# Patient Record
Sex: Male | Born: 1965 | Race: White | Hispanic: No | Marital: Single | Smoking: Current every day smoker
Health system: Southern US, Community
[De-identification: ages and names within clinical notes are randomized; demographics above are authoritative.]

## PROBLEM LIST (undated history)

## (undated) DIAGNOSIS — M1990 Unspecified osteoarthritis, unspecified site: Secondary | ICD-10-CM

## (undated) DIAGNOSIS — M545 Low back pain, unspecified: Secondary | ICD-10-CM

## (undated) DIAGNOSIS — E039 Hypothyroidism, unspecified: Secondary | ICD-10-CM

## (undated) DIAGNOSIS — B182 Chronic viral hepatitis C: Secondary | ICD-10-CM

## (undated) DIAGNOSIS — F1910 Other psychoactive substance abuse, uncomplicated: Secondary | ICD-10-CM

## (undated) DIAGNOSIS — G4730 Sleep apnea, unspecified: Secondary | ICD-10-CM

## (undated) DIAGNOSIS — E785 Hyperlipidemia, unspecified: Secondary | ICD-10-CM

## (undated) DIAGNOSIS — F1020 Alcohol dependence, uncomplicated: Secondary | ICD-10-CM

## (undated) DIAGNOSIS — J439 Emphysema, unspecified: Secondary | ICD-10-CM

## (undated) HISTORY — DX: Unspecified osteoarthritis, unspecified site: M19.90

## (undated) HISTORY — DX: Chronic viral hepatitis C: B18.2

## (undated) HISTORY — DX: Hyperlipidemia, unspecified: E78.5

## (undated) HISTORY — PX: APPENDECTOMY: SHX54

## (undated) HISTORY — DX: Other psychoactive substance abuse, uncomplicated: F19.10

## (undated) HISTORY — DX: Low back pain: M54.5

## (undated) HISTORY — DX: Low back pain, unspecified: M54.50

## (undated) HISTORY — DX: Sleep apnea, unspecified: G47.30

## (undated) HISTORY — DX: Emphysema, unspecified: J43.9

---

## 2007-09-03 ENCOUNTER — Ambulatory Visit: Payer: Self-pay | Admitting: Gastroenterology

## 2007-09-26 ENCOUNTER — Ambulatory Visit: Payer: Self-pay | Admitting: Gastroenterology

## 2013-08-06 ENCOUNTER — Ambulatory Visit: Payer: Self-pay | Admitting: Family Medicine

## 2013-08-15 ENCOUNTER — Ambulatory Visit: Payer: Self-pay | Admitting: Family Medicine

## 2014-08-01 ENCOUNTER — Emergency Department: Payer: Self-pay | Admitting: Emergency Medicine

## 2014-08-01 LAB — CBC
HCT: 50.5 % (ref 40.0–52.0)
HGB: 15.9 g/dL (ref 13.0–18.0)
MCH: 28.6 pg (ref 26.0–34.0)
MCHC: 31.5 g/dL — AB (ref 32.0–36.0)
MCV: 91 fL (ref 80–100)
Platelet: 209 10*3/uL (ref 150–440)
RBC: 5.55 10*6/uL (ref 4.40–5.90)
RDW: 14.1 % (ref 11.5–14.5)
WBC: 9.4 10*3/uL (ref 3.8–10.6)

## 2014-08-01 LAB — URINALYSIS, COMPLETE
BACTERIA: NONE SEEN
BLOOD: NEGATIVE
Bilirubin,UR: NEGATIVE
GLUCOSE, UR: NEGATIVE mg/dL (ref 0–75)
Ketone: NEGATIVE
LEUKOCYTE ESTERASE: NEGATIVE
Nitrite: NEGATIVE
PROTEIN: NEGATIVE
Ph: 7 (ref 4.5–8.0)
SPECIFIC GRAVITY: 1.004 (ref 1.003–1.030)
SQUAMOUS EPITHELIAL: NONE SEEN
WBC UR: NONE SEEN /HPF (ref 0–5)

## 2014-08-01 LAB — BASIC METABOLIC PANEL
Anion Gap: 12 (ref 7–16)
BUN: 10 mg/dL (ref 7–18)
CO2: 24 mmol/L (ref 21–32)
Calcium, Total: 8.6 mg/dL (ref 8.5–10.1)
Chloride: 105 mmol/L (ref 98–107)
Creatinine: 1.07 mg/dL (ref 0.60–1.30)
EGFR (African American): >60
Glucose: 96 mg/dL (ref 65–99)
Osmolality: 280 (ref 275–301)
POTASSIUM: 3.8 mmol/L (ref 3.5–5.1)
SODIUM: 141 mmol/L (ref 136–145)

## 2014-08-01 LAB — TROPONIN I: Troponin-I: <0.02 ng/mL

## 2015-08-05 ENCOUNTER — Ambulatory Visit: Payer: Self-pay | Admitting: Family Medicine

## 2015-08-19 ENCOUNTER — Telehealth: Payer: Self-pay | Admitting: Family Medicine

## 2015-08-19 DIAGNOSIS — E039 Hypothyroidism, unspecified: Secondary | ICD-10-CM | POA: Insufficient documentation

## 2015-08-19 NOTE — Telephone Encounter (Signed)
Pt would like a call back concerning antidepressants . He had to leave the rehab today and says its urgent that he speak with somebody.

## 2015-08-20 NOTE — Telephone Encounter (Signed)
Phone call returned lunch and end of day on Wednesday and Thursday, with no return call from patient. I did leave messages that Dr. Jeananne Rama was leaving for vacation but call office for another provider if needed

## 2015-09-22 ENCOUNTER — Encounter: Payer: Self-pay | Admitting: Family Medicine

## 2015-09-22 ENCOUNTER — Ambulatory Visit (INDEPENDENT_AMBULATORY_CARE_PROVIDER_SITE_OTHER): Payer: BLUE CROSS/BLUE SHIELD | Admitting: Family Medicine

## 2015-09-22 VITALS — BP 121/84 | HR 72 | Temp 98.4°F | Ht 66.0 in | Wt 186.0 lb

## 2015-09-22 DIAGNOSIS — F32A Depression, unspecified: Secondary | ICD-10-CM

## 2015-09-22 DIAGNOSIS — F1910 Other psychoactive substance abuse, uncomplicated: Secondary | ICD-10-CM

## 2015-09-22 DIAGNOSIS — Z23 Encounter for immunization: Secondary | ICD-10-CM | POA: Diagnosis not present

## 2015-09-22 DIAGNOSIS — IMO0001 Reserved for inherently not codable concepts without codable children: Secondary | ICD-10-CM

## 2015-09-22 DIAGNOSIS — F329 Major depressive disorder, single episode, unspecified: Secondary | ICD-10-CM | POA: Diagnosis not present

## 2015-09-22 DIAGNOSIS — F1020 Alcohol dependence, uncomplicated: Secondary | ICD-10-CM

## 2015-09-22 DIAGNOSIS — F1010 Alcohol abuse, uncomplicated: Secondary | ICD-10-CM | POA: Insufficient documentation

## 2015-09-22 HISTORY — DX: Reserved for inherently not codable concepts without codable children: IMO0001

## 2015-09-22 MED ORDER — DISULFIRAM 250 MG PO TABS: 250.0000 mg | ORAL_TABLET | Freq: Every day | ORAL | Status: DC

## 2015-09-22 MED ORDER — DISULFIRAM 500 MG PO TABS: 500.0000 mg | ORAL_TABLET | Freq: Every day | ORAL | Status: DC

## 2015-09-22 NOTE — Assessment & Plan Note (Signed)
Patient also abusing cocaine

## 2015-09-22 NOTE — Progress Notes (Signed)
   BP 121/84 mmHg  Pulse 72  Temp(Src) 98.4 F (36.9 C)  Ht 5\' 6"  (1.676 m)  Wt 186 lb (84.369 kg)  BMI 30.04 kg/m2  SpO2 97%   Subjective:    Patient ID: Earl Baker, male    DOB: [birth year], 49 y.o.   MRN: [id number]  HPI: Earl Baker is a 49 y.o. male  Chief Complaint  Patient presents with  . alcohol use   patient working with a therapist still having depression fluoxetine hasn't helped with stopping alcohol still drinking on a regular basis Patient tried a was inpatient program and and insurance only covered 1 week which obviously wasn't enough Patient and Have Discussed Antabuse Has Worked in the past and Wants to Get Started on That Again. Taking fluoxetine without side effects Taking thyroid medications without side effects. Patient still able to work  Relevant past medical, surgical, family and social history reviewed and updated as indicated. Interim medical history since our last visit reviewed. Allergies and medications reviewed and updated.  Review of Systems  Constitutional: Negative.   Respiratory: Negative.   Cardiovascular: Negative.     Per HPI unless specifically indicated above     Objective:    BP 121/84 mmHg  Pulse 72  Temp(Src) 98.4 F (36.9 C)  Ht 5\' 6"  (1.676 m)  Wt 186 lb (84.369 kg)  BMI 30.04 kg/m2  SpO2 97%  Wt Readings from Last 3 Encounters:  09/22/15 186 lb (84.369 kg)  04/29/15 183 lb (83.008 kg)    Physical Exam  Constitutional: He is oriented to person, place, and time. He appears well-developed and well-nourished. No distress.  HENT:  Head: Normocephalic and atraumatic.  Right Ear: Hearing normal.  Left Ear: Hearing normal.  Nose: Nose normal.  Eyes: Conjunctivae and lids are normal. Right eye exhibits no discharge. Left eye exhibits no discharge. No scleral icterus.  Cardiovascular: Normal rate, regular rhythm and normal heart sounds.   Pulmonary/Chest: Effort normal and breath sounds normal. No  respiratory distress.  Musculoskeletal: Normal range of motion.  Neurological: He is alert and oriented to person, place, and time.  Skin: Skin is intact. No rash noted.  Psychiatric: He has a normal mood and affect. His speech is normal and behavior is normal. Judgment and thought content normal. Cognition and memory are normal.        Assessment & Plan:   Problem List Items Addressed This Visit      Other   Alcoholism /alcohol abuse ([place])    Discuss treatment and risk will begin Antabuse 500 mg a day for 2 weeks then dropped to 250 one a day Patient will make sure he is off alcohol for 12 hours to start Discuss cocaine cessation as doesn't do cocaine unless his drinking. Also doesn't smoke and was his drinking.      Substance abuse    Patient also abusing cocaine      Depression    Will continue fluoxetine       Other Visit Diagnoses    Immunization due    -  Primary    Relevant Orders    Flu Vaccine QUAD 36+ mos PF IM (Fluarix & Fluzone Quad PF) (Completed)        Follow up plan: Return in about 4 weeks (around 10/20/2015), or if symptoms worsen or fail to improve, for re check.

## 2015-09-22 NOTE — Assessment & Plan Note (Signed)
Will continue fluoxetine

## 2015-09-22 NOTE — Assessment & Plan Note (Signed)
Discuss treatment and risk will begin Antabuse 500 mg a day for 2 weeks then dropped to 250 one a day Patient will make sure he is off alcohol for 12 hours to start Discuss cocaine cessation as doesn't do cocaine unless his drinking. Also doesn't smoke and was his drinking.

## 2015-10-20 ENCOUNTER — Ambulatory Visit (INDEPENDENT_AMBULATORY_CARE_PROVIDER_SITE_OTHER): Payer: BLUE CROSS/BLUE SHIELD | Admitting: Family Medicine

## 2015-10-20 ENCOUNTER — Encounter: Payer: Self-pay | Admitting: Family Medicine

## 2015-10-20 VITALS — BP 109/75 | HR 79 | Temp 98.8°F | Ht 65.5 in | Wt 184.0 lb

## 2015-10-20 DIAGNOSIS — F32A Depression, unspecified: Secondary | ICD-10-CM

## 2015-10-20 DIAGNOSIS — IMO0001 Reserved for inherently not codable concepts without codable children: Secondary | ICD-10-CM

## 2015-10-20 DIAGNOSIS — F1020 Alcohol dependence, uncomplicated: Secondary | ICD-10-CM | POA: Diagnosis not present

## 2015-10-20 DIAGNOSIS — F329 Major depressive disorder, single episode, unspecified: Secondary | ICD-10-CM | POA: Diagnosis not present

## 2015-10-20 MED ORDER — FLUOXETINE HCL 20 MG PO TABS: 20.0000 mg | ORAL_TABLET | Freq: Every day | ORAL | Status: DC

## 2015-10-20 NOTE — Assessment & Plan Note (Signed)
Has been dry for a week now working with therapist Discussed Alcoholics Anonymous also

## 2015-10-20 NOTE — Progress Notes (Signed)
BP 109/75 mmHg  Pulse 79  Temp(Src) 98.8 F (37.1 C)  Ht 5' 5.5" (1.664 m)  Wt 184 lb (83.462 kg)  BMI 30.14 kg/m2  SpO2 97%   Subjective:    Patient ID: Earl Baker, male    DOB: September 11, 1966, 49 y.o.   MRN: [id number]  HPI: Earl Baker is a 49 y.o. male  No chief complaint on file.  patient follow-up has not drank for over a week and is playing golf with buddies. Still working with therapist on every two-week basis Taking Antabuse without any problems or issues Depression is stable on Prozac and taking without side effects Patient's developed mild URI symptoms some head congestion and drainage as been ongoing for about a week but getting better  Relevant past medical, surgical, family and social history reviewed and updated as indicated. Interim medical history since our last visit reviewed. Allergies and medications reviewed and updated. Other than noted above Review of Systems  Constitutional: Negative.   Respiratory: Negative.   Cardiovascular: Negative.     Per HPI unless specifically indicated above     Objective:    BP 109/75 mmHg  Pulse 79  Temp(Src) 98.8 F (37.1 C)  Ht 5' 5.5" (1.664 m)  Wt 184 lb (83.462 kg)  BMI 30.14 kg/m2  SpO2 97%  Wt Readings from Last 3 Encounters:  10/20/15 184 lb (83.462 kg)  09/22/15 186 lb (84.369 kg)  04/29/15 183 lb (83.008 kg)    Physical Exam  Constitutional: He is oriented to person, place, and time. He appears well-developed and well-nourished. No distress.  HENT:  Head: Normocephalic and atraumatic.  Right Ear: Hearing normal.  Left Ear: Hearing normal.  Nose: Nose normal.  Eyes: Conjunctivae and lids are normal. Right eye exhibits no discharge. Left eye exhibits no discharge. No scleral icterus.  Cardiovascular: Normal rate, regular rhythm and normal heart sounds.   Pulmonary/Chest: Effort normal and breath sounds normal. No respiratory distress.  Musculoskeletal: Normal range of motion.   Neurological: He is alert and oriented to person, place, and time.  Skin: Skin is intact. No rash noted.  Psychiatric: He has a normal mood and affect. His speech is normal and behavior is normal. Judgment and thought content normal. Cognition and memory are normal.    Results for orders placed or performed in visit on 08/01/14  Urinalysis, Complete  Result Value Ref Range   Color - urine Straw    Clarity - urine Clear    Glucose,UR Negative 0-75 mg/dL   Bilirubin,UR Negative NEGATIVE   Ketone Negative NEGATIVE   Specific Gravity 1.004 1.003-1.030   Blood Negative NEGATIVE   Ph 7.0 4.5-8.0   Protein Negative NEGATIVE   Nitrite Negative NEGATIVE   Leukocyte Esterase Negative NEGATIVE   RBC,UR 1 /HPF 0-5 /HPF   WBC UR NONE SEEN 0-5 /HPF   Bacteria NONE SEEN NONE SEEN   Squamous Epithelial NONE SEEN   Troponin I  Result Value Ref Range   Troponin-I < 0.02 ng/mL  CBC  Result Value Ref Range   WBC 9.4 3.8-10.6 x10 3/mm 3   RBC 5.55 4.40-5.90 x10 6/mm 3   HGB 15.9 13.0-18.0 g/dL   HCT 50.5 40.0-52.0 %   MCV 91 80-100 fL   MCH 28.6 26.0-34.0 pg   MCHC 31.5 (L) 32.0-36.0 g/dL   RDW 14.1 11.5-14.5 %   Platelet 209 150-440 x10 3/mm 3  Basic metabolic panel  Result Value Ref Range   Glucose 96 65-99 mg/dL  BUN 10 7-18 mg/dL   Creatinine 1.07 0.60-1.30 mg/dL   Sodium 141 136-145 mmol/L   Potassium 3.8 3.5-5.1 mmol/L   Chloride 105 98-107 mmol/L   Co2 24 21-32 mmol/L   Calcium, Total 8.6 8.5-10.1 mg/dL   Osmolality 280 275-301   Anion Gap 12 7-16   EGFR (African American) >60    EGFR (Non-African Amer.) >60       Assessment & Plan:   Problem List Items Addressed This Visit      Other   Depression    Patient doing stable doesn't feel like he needs alcohol to self medicate depression      Relevant Medications   FLUoxetine (PROZAC) 20 MG tablet   Alcoholism /alcohol abuse ([place]) - Primary    Has been dry for a week now working with therapist Discussed Alcoholics  Anonymous also         URI resolving Follow up plan: Return in about 6 months (around 04/18/2016) for Physical Exam. sooner if problems

## 2015-10-20 NOTE — Assessment & Plan Note (Signed)
Patient doing stable doesn't feel like he needs alcohol to self medicate depression

## 2015-12-01 ENCOUNTER — Encounter: Payer: Self-pay | Admitting: Family Medicine

## 2015-12-01 ENCOUNTER — Ambulatory Visit (INDEPENDENT_AMBULATORY_CARE_PROVIDER_SITE_OTHER): Payer: BLUE CROSS/BLUE SHIELD | Admitting: Family Medicine

## 2015-12-01 VITALS — BP 106/74 | HR 80 | Temp 97.8°F | Ht 65.1 in | Wt 182.0 lb

## 2015-12-01 DIAGNOSIS — R002 Palpitations: Secondary | ICD-10-CM

## 2015-12-01 NOTE — Assessment & Plan Note (Signed)
Patient with ongoing chronic palpitations had evaluation last year with to monitor which was negative Will proceed as they seem to have gotten worse and more persistent with 24-hour EKG and cardiology referral We'll try to get it all done this year as patient has met his deductible.

## 2015-12-01 NOTE — Progress Notes (Signed)
BP 106/74 mmHg  Pulse 80  Temp(Src) 97.8 F (36.6 C)  Ht 5' 5.1" (1.654 m)  Wt 182 lb (82.555 kg)  BMI 30.18 kg/m2  SpO2 95%   Subjective:    Patient ID: Earl Baker, male    DOB: 01-09-1966, 49 y.o.   MRN: [id number]  HPI: Earl Baker is a 49 y.o. male  Chief Complaint  Patient presents with  . Palpitations   patient with marked episodes of palpitations more when lying on his back or watching TV will have skipped beats that may be one a minute or so her some 10 minute then won't have any for a while there is no associated symptoms. No nausea vomiting diaphoresis patient's able to do exertion without problems. Patient not drinking Nerves doing okay  Relevant past medical, surgical, family and social history reviewed and updated as indicated. Interim medical history since our last visit reviewed. Allergies and medications reviewed and updated.  Review of Systems  Constitutional: Negative.   HENT: Negative.   Respiratory: Negative.   Cardiovascular:       Some occasional left chest dull pain  Gastrointestinal: Negative.     Per HPI unless specifically indicated above     Objective:    BP 106/74 mmHg  Pulse 80  Temp(Src) 97.8 F (36.6 C)  Ht 5' 5.1" (1.654 m)  Wt 182 lb (82.555 kg)  BMI 30.18 kg/m2  SpO2 95%  Wt Readings from Last 3 Encounters:  12/01/15 182 lb (82.555 kg)  10/20/15 184 lb (83.462 kg)  09/22/15 186 lb (84.369 kg)    Physical Exam  Constitutional: He is oriented to person, place, and time. He appears well-developed and well-nourished. No distress.  HENT:  Head: Normocephalic and atraumatic.  Right Ear: Hearing normal.  Left Ear: Hearing normal.  Nose: Nose normal.  Eyes: Conjunctivae and lids are normal. Right eye exhibits no discharge. Left eye exhibits no discharge. No scleral icterus.  Cardiovascular: Normal rate, regular rhythm and normal heart sounds.   Pulmonary/Chest: Effort normal and breath sounds normal. No  respiratory distress.  Musculoskeletal: Normal range of motion.  Neurological: He is alert and oriented to person, place, and time.  Skin: Skin is intact. No rash noted.  Psychiatric: He has a normal mood and affect. His speech is normal and behavior is normal. Judgment and thought content normal. Cognition and memory are normal.   Reviewed EKG normal sinus rhythm with no acute changes  Results for orders placed or performed in visit on 08/01/14  Urinalysis, Complete  Result Value Ref Range   Color - urine Straw    Clarity - urine Clear    Glucose,UR Negative 0-75 mg/dL   Bilirubin,UR Negative NEGATIVE   Ketone Negative NEGATIVE   Specific Gravity 1.004 1.003-1.030   Blood Negative NEGATIVE   Ph 7.0 4.5-8.0   Protein Negative NEGATIVE   Nitrite Negative NEGATIVE   Leukocyte Esterase Negative NEGATIVE   RBC,UR 1 /HPF 0-5 /HPF   WBC UR NONE SEEN 0-5 /HPF   Bacteria NONE SEEN NONE SEEN   Squamous Epithelial NONE SEEN   Troponin I  Result Value Ref Range   Troponin-I < 0.02 ng/mL  CBC  Result Value Ref Range   WBC 9.4 3.8-10.6 x10 3/mm 3   RBC 5.55 4.40-5.90 x10 6/mm 3   HGB 15.9 13.0-18.0 g/dL   HCT 50.5 40.0-52.0 %   MCV 91 80-100 fL   MCH 28.6 26.0-34.0 pg   MCHC 31.5 (L) 32.0-36.0 g/dL  RDW 14.1 11.5-14.5 %   Platelet 209 150-440 x10 3/mm 3  Basic metabolic panel  Result Value Ref Range   Glucose 96 65-99 mg/dL   BUN 10 7-18 mg/dL   Creatinine 1.07 0.60-1.30 mg/dL   Sodium 141 136-145 mmol/L   Potassium 3.8 3.5-5.1 mmol/L   Chloride 105 98-107 mmol/L   Co2 24 21-32 mmol/L   Calcium, Total 8.6 8.5-10.1 mg/dL   Osmolality 280 275-301   Anion Gap 12 7-16   EGFR (African American) >60    EGFR (Non-African Amer.) >60       Assessment & Plan:   Problem List Items Addressed This Visit      Other   Palpitations - Primary    Patient with ongoing chronic palpitations had evaluation last year with to monitor which was negative Will proceed as they seem to have  gotten worse and more persistent with 24-hour EKG and cardiology referral We'll try to get it all done this year as patient has met his deductible.      Relevant Orders   EKG 12-Lead (Completed)   Ambulatory referral to Cardiology   Comprehensive metabolic panel   CBC with Differential/Platelet   TSH       Follow up plan: Return in about 6 months (around 05/31/2016) for May for , Physical Exam.

## 2015-12-02 ENCOUNTER — Encounter: Payer: Self-pay | Admitting: Family Medicine

## 2015-12-02 LAB — CBC WITH DIFFERENTIAL/PLATELET
Basophils Absolute: 0 10*3/uL (ref 0.0–0.2)
Basos: 0 %
EOS (ABSOLUTE): 0.2 10*3/uL (ref 0.0–0.4)
EOS: 3 %
HEMATOCRIT: 47.2 % (ref 37.5–51.0)
HEMOGLOBIN: 15.8 g/dL (ref 12.6–17.7)
IMMATURE GRANS (ABS): 0 10*3/uL (ref 0.0–0.1)
IMMATURE GRANULOCYTES: 0 %
LYMPHS ABS: 2.6 10*3/uL (ref 0.7–3.1)
Lymphs: 35 %
MCH: 28.6 pg (ref 26.6–33.0)
MCHC: 33.5 g/dL (ref 31.5–35.7)
MCV: 85 fL (ref 79–97)
MONOCYTES: 13 %
Monocytes Absolute: 1 10*3/uL — ABNORMAL HIGH (ref 0.1–0.9)
Neutrophils Absolute: 3.7 10*3/uL (ref 1.4–7.0)
Neutrophils: 49 %
Platelets: 260 10*3/uL (ref 150–379)
RBC: 5.53 x10E6/uL (ref 4.14–5.80)
RDW: 13.8 % (ref 12.3–15.4)
WBC: 7.6 10*3/uL (ref 3.4–10.8)

## 2015-12-02 LAB — COMPREHENSIVE METABOLIC PANEL
Albumin/Globulin Ratio: 1.8 (ref 1.1–2.5)
Globulin, Total: 2.5 g/dL (ref 1.5–4.5)
Total Protein: 6.9 g/dL (ref 6.0–8.5)

## 2015-12-02 LAB — TSH: TSH: 1.01 u[IU]/mL (ref 0.450–4.500)

## 2015-12-08 ENCOUNTER — Telehealth: Payer: Self-pay

## 2015-12-08 DIAGNOSIS — R002 Palpitations: Secondary | ICD-10-CM

## 2015-12-08 NOTE — Telephone Encounter (Signed)
Call patient about Holter results

## 2015-12-09 NOTE — Assessment & Plan Note (Signed)
Phone call Review Holter monitor with patient today 12/09/2015 PACs patient has cardiology to further follow-up with Will observe for now.

## 2015-12-10 ENCOUNTER — Ambulatory Visit: Payer: Self-pay | Admitting: Cardiovascular Disease

## 2016-01-27 ENCOUNTER — Ambulatory Visit: Payer: Self-pay | Admitting: Cardiovascular Disease

## 2016-04-21 ENCOUNTER — Telehealth: Payer: Self-pay

## 2016-04-21 MED ORDER — FLUOXETINE HCL 20 MG PO TABS: 20.0000 mg | ORAL_TABLET | Freq: Every day | ORAL | Status: DC

## 2016-04-21 NOTE — Telephone Encounter (Signed)
[name] is requesting a 90 day Rx  Fluoxetine 20mg  TAB  Patient's appointment is May 25, 2016

## 2016-05-05 ENCOUNTER — Encounter: Payer: BLUE CROSS/BLUE SHIELD | Admitting: Family Medicine

## 2016-05-25 ENCOUNTER — Encounter: Payer: Self-pay | Admitting: Family Medicine

## 2016-05-25 ENCOUNTER — Ambulatory Visit (INDEPENDENT_AMBULATORY_CARE_PROVIDER_SITE_OTHER): Payer: BLUE CROSS/BLUE SHIELD | Admitting: Family Medicine

## 2016-05-25 VITALS — BP 123/82 | HR 67 | Temp 97.9°F | Ht 65.3 in | Wt 186.0 lb

## 2016-05-25 DIAGNOSIS — Z0000 Encounter for general adult medical examination without abnormal findings: Secondary | ICD-10-CM | POA: Diagnosis not present

## 2016-05-25 DIAGNOSIS — F32A Depression, unspecified: Secondary | ICD-10-CM

## 2016-05-25 DIAGNOSIS — F1910 Other psychoactive substance abuse, uncomplicated: Secondary | ICD-10-CM | POA: Diagnosis not present

## 2016-05-25 DIAGNOSIS — F329 Major depressive disorder, single episode, unspecified: Secondary | ICD-10-CM

## 2016-05-25 LAB — URINALYSIS, ROUTINE W REFLEX MICROSCOPIC
BILIRUBIN UA: NEGATIVE
Glucose, UA: NEGATIVE
Ketones, UA: NEGATIVE
Leukocytes, UA: NEGATIVE
NITRITE UA: NEGATIVE
PH UA: 7 (ref 5.0–7.5)
Protein, UA: NEGATIVE
RBC, UA: NEGATIVE
Specific Gravity, UA: 1.01 (ref 1.005–1.030)
UUROB: 0.2 mg/dL (ref 0.2–1.0)

## 2016-05-25 MED ORDER — DISULFIRAM 250 MG PO TABS: 250.0000 mg | ORAL_TABLET | Freq: Every day | ORAL | Status: DC

## 2016-05-25 MED ORDER — FLUOXETINE HCL 20 MG PO TABS: 20.0000 mg | ORAL_TABLET | Freq: Every day | ORAL | Status: DC

## 2016-05-25 MED ORDER — LEVOTHYROXINE SODIUM 75 MCG PO TABS: 75.0000 ug | ORAL_TABLET | Freq: Every day | ORAL | Status: DC

## 2016-05-25 NOTE — Assessment & Plan Note (Signed)
Discuss alcoholism and cessation Alcoholics Anonymous canceling therapy nutrition vitamins exercise

## 2016-05-25 NOTE — Addendum Note (Signed)
Addended by: Wynn Maudlin on: 05/25/2016 03:53 PM   Modules accepted: Miquel Dunn

## 2016-05-25 NOTE — Assessment & Plan Note (Signed)
The current medical regimen is effective;  continue present plan and medications.  

## 2016-05-25 NOTE — Progress Notes (Signed)
BP 123/82 mmHg  Pulse 67  Temp(Src) 97.9 F (36.6 C)  Ht 5' 5.3" (1.659 m)  Wt 186 lb (84.369 kg)  BMI 30.65 kg/m2  SpO2 96%   Subjective:    Patient ID: Earl Baker, male    DOB: 12-Aug-1966, 50 y.o.   MRN: [id number]  HPI: Earl Baker is a 50 y.o. male  Chief Complaint  Patient presents with  . Annual Exam   Patient follow-up doing well with thyroid depression doing okay patient stopped Antabuse this winter and is been drinking on a regular basis since then. Has had trouble with work with some attendance issues. Knows he needs to stop again Is aware of aid and Alcoholics Anonymous therapists Also discussed no weight gain with drinking large amounts of alcohol means malnutrition patient admits to eating mostly [employer] hamburgers. Patient is taking a multiple vitamin Relevant past medical, surgical, family and social history reviewed and updated as indicated. Interim medical history since our last visit reviewed. Allergies and medications reviewed and updated.  Other than above Review of Systems  Constitutional: Negative.   HENT: Negative.   Eyes: Negative.   Respiratory: Negative.   Cardiovascular: Negative.   Gastrointestinal: Negative.   Endocrine: Negative.   Genitourinary: Negative.   Musculoskeletal: Negative.   Skin: Negative.   Allergic/Immunologic: Negative.   Neurological: Negative.   Hematological: Negative.   Psychiatric/Behavioral: Negative.     Per HPI unless specifically indicated above     Objective:    BP 123/82 mmHg  Pulse 67  Temp(Src) 97.9 F (36.6 C)  Ht 5' 5.3" (1.659 m)  Wt 186 lb (84.369 kg)  BMI 30.65 kg/m2  SpO2 96%  Wt Readings from Last 3 Encounters:  05/25/16 186 lb (84.369 kg)  12/01/15 182 lb (82.555 kg)  10/20/15 184 lb (83.462 kg)    Physical Exam  Constitutional: He is oriented to person, place, and time. He appears well-developed and well-nourished.  HENT:  Head: Normocephalic and atraumatic.   Right Ear: External ear normal.  Left Ear: External ear normal.  Eyes: Conjunctivae and EOM are normal. Pupils are equal, round, and reactive to light.  Neck: Normal range of motion. Neck supple.  Cardiovascular: Normal rate, regular rhythm, normal heart sounds and intact distal pulses.   Pulmonary/Chest: Effort normal and breath sounds normal.  Abdominal: Soft. Bowel sounds are normal. There is no splenomegaly or hepatomegaly.  Genitourinary: Rectum normal, prostate normal and penis normal.  Musculoskeletal: Normal range of motion.  Neurological: He is alert and oriented to person, place, and time. He has normal reflexes.  Skin: No rash noted. No erythema.  Psychiatric: He has a normal mood and affect. His behavior is normal. Judgment and thought content normal.    Results for orders placed or performed in visit on 12/01/15  Comprehensive metabolic panel  Result Value Ref Range   Total Protein 6.9 6.0 - 8.5 g/dL   Globulin, Total 2.5 1.5 - 4.5 g/dL   Albumin/Globulin Ratio 1.8 1.1 - 2.5  CBC with Differential/Platelet  Result Value Ref Range   WBC 7.6 3.4 - 10.8 x10E3/uL   RBC 5.53 4.14 - 5.80 x10E6/uL   Hemoglobin 15.8 12.6 - 17.7 g/dL   Hematocrit 47.2 37.5 - 51.0 %   MCV 85 79 - 97 fL   MCH 28.6 26.6 - 33.0 pg   MCHC 33.5 31.5 - 35.7 g/dL   RDW 13.8 12.3 - 15.4 %   Platelets 260 150 - 379 x10E3/uL   Neutrophils  49 %   Lymphs 35 %   Monocytes 13 %   Eos 3 %   Basos 0 %   Neutrophils Absolute 3.7 1.4 - 7.0 x10E3/uL   Lymphocytes Absolute 2.6 0.7 - 3.1 x10E3/uL   Monocytes Absolute 1.0 (H) 0.1 - 0.9 x10E3/uL   EOS (ABSOLUTE) 0.2 0.0 - 0.4 x10E3/uL   Basophils Absolute 0.0 0.0 - 0.2 x10E3/uL   Immature Granulocytes 0 %   Immature Grans (Abs) 0.0 0.0 - 0.1 x10E3/uL  TSH  Result Value Ref Range   TSH 1.010 0.450 - 4.500 uIU/mL      Assessment & Plan:   Problem List Items Addressed This Visit      Other   Substance abuse    Discuss alcoholism and cessation  Alcoholics Anonymous canceling therapy nutrition vitamins exercise       Depression    The current medical regimen is effective;  continue present plan and medications.       Relevant Medications   FLUoxetine (PROZAC) 20 MG tablet    Other Visit Diagnoses    Routine general medical examination at a health care facility    -  Primary    Relevant Orders    CBC with Differential/Platelet    Comprehensive metabolic panel    Lipid Panel w/o Chol/HDL Ratio    PSA    TSH    Urinalysis, Routine w reflex microscopic (not at [facility])        Follow up plan: Return in about 6 months (around 11/24/2016) for med check.

## 2016-05-26 ENCOUNTER — Encounter: Payer: Self-pay | Admitting: Unknown Physician Specialty

## 2016-05-26 LAB — CBC WITH DIFFERENTIAL/PLATELET
BASOS: 1 %
Basophils Absolute: 0.1 10*3/uL (ref 0.0–0.2)
EOS (ABSOLUTE): 0.2 10*3/uL (ref 0.0–0.4)
EOS: 3 %
HEMATOCRIT: 47.4 % (ref 37.5–51.0)
Hemoglobin: 15.4 g/dL (ref 12.6–17.7)
IMMATURE GRANS (ABS): 0 10*3/uL (ref 0.0–0.1)
IMMATURE GRANULOCYTES: 0 %
LYMPHS: 37 %
Lymphocytes Absolute: 2.8 10*3/uL (ref 0.7–3.1)
MCH: 28.2 pg (ref 26.6–33.0)
MCHC: 32.5 g/dL (ref 31.5–35.7)
MCV: 87 fL (ref 79–97)
MONOS ABS: 0.8 10*3/uL (ref 0.1–0.9)
Monocytes: 11 %
NEUTROS ABS: 3.7 10*3/uL (ref 1.4–7.0)
NEUTROS PCT: 48 %
Platelets: 216 10*3/uL (ref 150–379)
RBC: 5.46 x10E6/uL (ref 4.14–5.80)
RDW: 14.4 % (ref 12.3–15.4)
WBC: 7.5 10*3/uL (ref 3.4–10.8)

## 2016-05-26 LAB — COMPREHENSIVE METABOLIC PANEL
A/G RATIO: 1.7 (ref 1.2–2.2)
ALT: 35 IU/L (ref 0–44)
AST: 30 IU/L (ref 0–40)
Albumin: 4.5 g/dL (ref 3.5–5.5)
Alkaline Phosphatase: 86 IU/L (ref 39–117)
BUN / CREAT RATIO: 11 (ref 9–20)
BUN: 11 mg/dL (ref 6–24)
Bilirubin Total: 1 mg/dL (ref 0.0–1.2)
CALCIUM: 9.6 mg/dL (ref 8.7–10.2)
CO2: 22 mmol/L (ref 18–29)
Chloride: 97 mmol/L (ref 96–106)
Creatinine, Ser: 1.04 mg/dL (ref 0.76–1.27)
GFR, EST AFRICAN AMERICAN: 98 mL/min/{1.73_m2} (ref >59)
GFR, EST NON AFRICAN AMERICAN: 85 mL/min/{1.73_m2} (ref >59)
GLOBULIN, TOTAL: 2.6 g/dL (ref 1.5–4.5)
Glucose: 88 mg/dL (ref 65–99)
POTASSIUM: 4.7 mmol/L (ref 3.5–5.2)
SODIUM: 138 mmol/L (ref 134–144)
TOTAL PROTEIN: 7.1 g/dL (ref 6.0–8.5)

## 2016-05-26 LAB — LIPID PANEL W/O CHOL/HDL RATIO
Cholesterol, Total: 230 mg/dL — ABNORMAL HIGH (ref 100–199)
HDL: 44 mg/dL (ref >39)
LDL Calculated: 145 mg/dL — ABNORMAL HIGH (ref 0–99)
Triglycerides: 204 mg/dL — ABNORMAL HIGH (ref 0–149)
VLDL Cholesterol Cal: 41 mg/dL — ABNORMAL HIGH (ref 5–40)

## 2016-05-26 LAB — PSA: PROSTATE SPECIFIC AG, SERUM: 0.8 ng/mL (ref 0.0–4.0)

## 2016-05-26 LAB — TSH: TSH: 2.61 u[IU]/mL (ref 0.450–4.500)

## 2016-09-28 ENCOUNTER — Ambulatory Visit (INDEPENDENT_AMBULATORY_CARE_PROVIDER_SITE_OTHER): Payer: BLUE CROSS/BLUE SHIELD | Admitting: Family Medicine

## 2016-09-28 ENCOUNTER — Encounter: Payer: Self-pay | Admitting: Family Medicine

## 2016-09-28 VITALS — BP 125/79 | HR 71 | Temp 97.8°F | Ht 66.0 in | Wt 194.3 lb

## 2016-09-28 DIAGNOSIS — R109 Unspecified abdominal pain: Secondary | ICD-10-CM | POA: Diagnosis not present

## 2016-09-28 DIAGNOSIS — Z23 Encounter for immunization: Secondary | ICD-10-CM

## 2016-09-28 DIAGNOSIS — R1084 Generalized abdominal pain: Secondary | ICD-10-CM

## 2016-09-28 DIAGNOSIS — R51 Headache: Secondary | ICD-10-CM

## 2016-09-28 DIAGNOSIS — R519 Headache, unspecified: Secondary | ICD-10-CM | POA: Insufficient documentation

## 2016-09-28 LAB — URINALYSIS, ROUTINE W REFLEX MICROSCOPIC
Bilirubin, UA: NEGATIVE
Glucose, UA: NEGATIVE
Ketones, UA: NEGATIVE
Nitrite, UA: NEGATIVE
Protein, UA: NEGATIVE
Specific Gravity, UA: 1.015 (ref 1.005–1.030)
Urobilinogen, Ur: 0.2 mg/dL (ref 0.2–1.0)
pH, UA: 8.5 — ABNORMAL HIGH (ref 5.0–7.5)

## 2016-09-28 LAB — MICROSCOPIC EXAMINATION
EPITHELIAL CELLS (NON RENAL): NONE SEEN /HPF (ref 0–10)
WBC UA: NONE SEEN /HPF (ref 0–?)

## 2016-09-28 NOTE — Assessment & Plan Note (Signed)
Reviewed sudden severe headache with exertion. Patient will avoid its insertion will get emergent CT scan Patient will also need neurology referral Patient education given on 911

## 2016-09-28 NOTE — Assessment & Plan Note (Signed)
Discuss abdominal pain observing for change in symptoms if so will need to further evaluate

## 2016-09-28 NOTE — Progress Notes (Signed)
BP 125/79 (BP Location: Left Arm, Patient Position: Sitting, Cuff Size: Normal)   Pulse 71   Temp 97.8 F (36.6 C)   Ht 5\' 6"  (1.676 m)   Wt 194 lb 4.8 oz (88.1 kg)   SpO2 96%   BMI 31.36 kg/m    Subjective:    Patient ID: Earl Baker, male    DOB: 14-Dec-1966, 50 y.o.   MRN: [id number]  HPI: Earl Baker is a 50 y.o. male  Chief Complaint  Patient presents with  . Abdominal Pain    Right side  . Back Pain  Patient with 2-3 weeks of right-sided abdominal pain noticed no abdominal symptoms GI symptoms or urinary symptoms no known specific strain or irritation. No radicular symptoms or other back type symptoms.  At the end of the visit patient with an oh by the way I've had the worst headache of my life with orgasm. This happened several weeks ago then happened again 2 days ago to the point had to stop. Patient still has some headache symptoms. No neurological symptoms.  Relevant past medical, surgical, family and social history reviewed and updated as indicated. Interim medical history since our last visit reviewed. Allergies and medications reviewed and updated.  Review of Systems  Constitutional: Negative.   HENT: Negative.   Eyes: Negative.   Respiratory: Negative.   Cardiovascular: Negative.   Gastrointestinal: Negative.   Endocrine: Negative.   Genitourinary: Negative.   Musculoskeletal: Negative.   Skin: Negative.   Allergic/Immunologic: Negative.   Neurological: Negative.   Hematological: Negative.   Psychiatric/Behavioral: Negative.     Per HPI unless specifically indicated above     Objective:    BP 125/79 (BP Location: Left Arm, Patient Position: Sitting, Cuff Size: Normal)   Pulse 71   Temp 97.8 F (36.6 C)   Ht 5\' 6"  (1.676 m)   Wt 194 lb 4.8 oz (88.1 kg)   SpO2 96%   BMI 31.36 kg/m   Wt Readings from Last 3 Encounters:  09/28/16 194 lb 4.8 oz (88.1 kg)  05/25/16 186 lb (84.4 kg)  12/01/15 182 lb (82.6 kg)    Physical Exam    Constitutional: He is oriented to person, place, and time. He appears well-developed and well-nourished. No distress.  HENT:  Head: Normocephalic and atraumatic.  Right Ear: Hearing normal.  Left Ear: Hearing normal.  Nose: Nose normal.  Mouth/Throat: Oropharynx is clear and moist.  Eyes: Conjunctivae, EOM and lids are normal. Pupils are equal, round, and reactive to light. Right eye exhibits no discharge. Left eye exhibits no discharge. No scleral icterus.  Neck: Normal range of motion. No tracheal deviation present. No thyromegaly present.  Cardiovascular: Normal rate, regular rhythm and normal heart sounds.   Pulmonary/Chest: Effort normal and breath sounds normal. No respiratory distress. He has no wheezes. He has no rales.  Abdominal: Soft. Bowel sounds are normal. He exhibits no distension and no mass. There is no tenderness. There is no rebound and no guarding.  Genitourinary: Penis normal.  Genitourinary Comments: No evidence of hernia  Musculoskeletal: Normal range of motion.  Lymphadenopathy:    He has no cervical adenopathy.  Neurological: He is alert and oriented to person, place, and time. He displays normal reflexes. No cranial nerve deficit. He exhibits normal muscle tone. Coordination normal.  Skin: Skin is warm, dry and intact. No rash noted.  Psychiatric: He has a normal mood and affect. His speech is normal and behavior is normal. Judgment and thought  content normal. Cognition and memory are normal.    Results for orders placed or performed in visit on 05/25/16  CBC with Differential/Platelet  Result Value Ref Range   WBC 7.5 3.4 - 10.8 x10E3/uL   RBC 5.46 4.14 - 5.80 x10E6/uL   Hemoglobin 15.4 12.6 - 17.7 g/dL   Hematocrit 47.4 37.5 - 51.0 %   MCV 87 79 - 97 fL   MCH 28.2 26.6 - 33.0 pg   MCHC 32.5 31.5 - 35.7 g/dL   RDW 14.4 12.3 - 15.4 %   Platelets 216 150 - 379 x10E3/uL   Neutrophils 48 %   Lymphs 37 %   Monocytes 11 %   Eos 3 %   Basos 1 %    Neutrophils Absolute 3.7 1.4 - 7.0 x10E3/uL   Lymphocytes Absolute 2.8 0.7 - 3.1 x10E3/uL   Monocytes Absolute 0.8 0.1 - 0.9 x10E3/uL   EOS (ABSOLUTE) 0.2 0.0 - 0.4 x10E3/uL   Basophils Absolute 0.1 0.0 - 0.2 x10E3/uL   Immature Granulocytes 0 %   Immature Grans (Abs) 0.0 0.0 - 0.1 x10E3/uL  Comprehensive metabolic panel  Result Value Ref Range   Glucose 88 65 - 99 mg/dL   BUN 11 6 - 24 mg/dL   Creatinine, Ser 1.04 0.76 - 1.27 mg/dL   GFR calc non Af Amer 85 >59 mL/min/1.73   GFR calc Af Amer 98 >59 mL/min/1.73   BUN/Creatinine Ratio 11 9 - 20   Sodium 138 134 - 144 mmol/L   Potassium 4.7 3.5 - 5.2 mmol/L   Chloride 97 96 - 106 mmol/L   CO2 22 18 - 29 mmol/L   Calcium 9.6 8.7 - 10.2 mg/dL   Total Protein 7.1 6.0 - 8.5 g/dL   Albumin 4.5 3.5 - 5.5 g/dL   Globulin, Total 2.6 1.5 - 4.5 g/dL   Albumin/Globulin Ratio 1.7 1.2 - 2.2   Bilirubin Total 1.0 0.0 - 1.2 mg/dL   Alkaline Phosphatase 86 39 - 117 IU/L   AST 30 0 - 40 IU/L   ALT 35 0 - 44 IU/L  Lipid Panel w/o Chol/HDL Ratio  Result Value Ref Range   Cholesterol, Total 230 (H) 100 - 199 mg/dL   Triglycerides 204 (H) 0 - 149 mg/dL   HDL 44 >39 mg/dL   VLDL Cholesterol Cal 41 (H) 5 - 40 mg/dL   LDL Calculated 145 (H) 0 - 99 mg/dL  PSA  Result Value Ref Range   Prostate Specific Ag, Serum 0.8 0.0 - 4.0 ng/mL  TSH  Result Value Ref Range   TSH 2.610 0.450 - 4.500 uIU/mL  Urinalysis, Routine w reflex microscopic (not at [facility])  Result Value Ref Range   Specific Gravity, UA 1.010 1.005 - 1.030   pH, UA 7.0 5.0 - 7.5   Color, UA Yellow Yellow   Appearance Ur Clear Clear   Leukocytes, UA Negative Negative   Protein, UA Negative Negative/Trace   Glucose, UA Negative Negative   Ketones, UA Negative Negative   RBC, UA Negative Negative   Bilirubin, UA Negative Negative   Urobilinogen, Ur 0.2 0.2 - 1.0 mg/dL   Nitrite, UA Negative Negative      Assessment & Plan:   Problem List Items Addressed This Visit      Other    Abdominal pain    Discuss abdominal pain observing for change in symptoms if so will need to further evaluate      Sudden onset of severe headache    Reviewed  sudden severe headache with exertion. Patient will avoid its insertion will get emergent CT scan Patient will also need neurology referral Patient education given on 911      Relevant Orders   CT Head Wo Contrast    Other Visit Diagnoses    Flank pain    -  Primary   Relevant Orders   Urinalysis, Routine w reflex microscopic (not at [facility])   Need for influenza vaccination       Relevant Orders   Flu Vaccine QUAD 36+ mos PF IM (Fluarix & Fluzone Quad PF) (Completed)       Follow up plan: Return if symptoms worsen or fail to improve, for Pending results.

## 2016-09-29 ENCOUNTER — Ambulatory Visit: Payer: Self-pay

## 2016-09-30 ENCOUNTER — Ambulatory Visit: Payer: Self-pay

## 2016-10-03 ENCOUNTER — Telehealth: Payer: Self-pay | Admitting: Family Medicine

## 2016-10-03 ENCOUNTER — Ambulatory Visit
Admission: RE | Admit: 2016-10-03 | Discharge: 2016-10-03 | Disposition: A | Discharge status: Home / Self Care | Payer: BLUE CROSS/BLUE SHIELD | Source: Ambulatory Visit | Attending: Family Medicine | Admitting: Family Medicine

## 2016-10-03 DIAGNOSIS — R51 Headache: Secondary | ICD-10-CM | POA: Diagnosis not present

## 2016-10-03 DIAGNOSIS — R519 Headache, unspecified: Secondary | ICD-10-CM

## 2016-10-03 MED ORDER — MELOXICAM 15 MG PO TABS: 15.0000 mg | ORAL_TABLET | Freq: Every day | ORAL | 3 refills | Status: DC

## 2016-10-03 NOTE — Telephone Encounter (Signed)
Phone call Discussed with patient still having headaches more constant now instead of intermittent we'll give meloxicam and neurology appointment.

## 2016-10-17 DIAGNOSIS — G4482 Headache associated with sexual activity: Secondary | ICD-10-CM | POA: Insufficient documentation

## 2016-11-23 DIAGNOSIS — G4482 Headache associated with sexual activity: Secondary | ICD-10-CM | POA: Diagnosis not present

## 2016-11-23 DIAGNOSIS — R202 Paresthesia of skin: Secondary | ICD-10-CM | POA: Insufficient documentation

## 2016-11-24 ENCOUNTER — Ambulatory Visit: Payer: BLUE CROSS/BLUE SHIELD | Admitting: Family Medicine

## 2016-11-28 ENCOUNTER — Ambulatory Visit (INDEPENDENT_AMBULATORY_CARE_PROVIDER_SITE_OTHER): Payer: BLUE CROSS/BLUE SHIELD | Admitting: Family Medicine

## 2016-11-28 DIAGNOSIS — F1020 Alcohol dependence, uncomplicated: Secondary | ICD-10-CM | POA: Diagnosis not present

## 2016-11-28 DIAGNOSIS — E039 Hypothyroidism, unspecified: Secondary | ICD-10-CM | POA: Diagnosis not present

## 2016-11-28 DIAGNOSIS — IMO0001 Reserved for inherently not codable concepts without codable children: Secondary | ICD-10-CM

## 2016-11-28 DIAGNOSIS — F325 Major depressive disorder, single episode, in full remission: Secondary | ICD-10-CM | POA: Diagnosis not present

## 2016-11-28 NOTE — Progress Notes (Signed)
BP 135/76 (BP Location: Left Arm, Patient Position: Sitting, Cuff Size: Normal)   Pulse 79   Temp 97.8 F (36.6 C)   Ht 5\' 6"  (1.676 m)   Wt 195 lb (88.5 kg)   SpO2 98%   BMI 31.47 kg/m    Subjective:    Patient ID: Earl Baker, male    DOB: 22-Apr-1966, 50 y.o.   MRN: [id number]  HPI: Earl Baker is a 50 y.o. male  Chief Complaint  Patient presents with  . Medication Follow-up  Patient follow-up is largely cut back on his drinking hasn't stopped at this point headaches are much better reviewed neurology notes from last week. Patient getting ready to stop altogether next year.  Depression doing well Taking thyroid medicines without problems Will start Antabuse next year when starting to quit drinking. Relevant past medical, surgical, family and social history reviewed and updated as indicated. Interim medical history since our last visit reviewed. Allergies and medications reviewed and updated.  Review of Systems  Constitutional: Negative.   Respiratory: Negative.   Cardiovascular: Negative.     Per HPI unless specifically indicated above     Objective:    BP 135/76 (BP Location: Left Arm, Patient Position: Sitting, Cuff Size: Normal)   Pulse 79   Temp 97.8 F (36.6 C)   Ht 5\' 6"  (1.676 m)   Wt 195 lb (88.5 kg)   SpO2 98%   BMI 31.47 kg/m   Wt Readings from Last 3 Encounters:  11/28/16 195 lb (88.5 kg)  09/28/16 194 lb 4.8 oz (88.1 kg)  05/25/16 186 lb (84.4 kg)    Physical Exam  Constitutional: He is oriented to person, place, and time. He appears well-developed and well-nourished. No distress.  HENT:  Head: Normocephalic and atraumatic.  Right Ear: Hearing normal.  Left Ear: Hearing normal.  Nose: Nose normal.  Eyes: Conjunctivae and lids are normal. Right eye exhibits no discharge. Left eye exhibits no discharge. No scleral icterus.  Cardiovascular: Normal rate, regular rhythm and normal heart sounds.   Pulmonary/Chest: Effort normal  and breath sounds normal. No respiratory distress.  Musculoskeletal: Normal range of motion.  Neurological: He is alert and oriented to person, place, and time.  Skin: Skin is intact. No rash noted.  Psychiatric: He has a normal mood and affect. His speech is normal and behavior is normal. Judgment and thought content normal. Cognition and memory are normal.    Results for orders placed or performed in visit on 09/28/16  Microscopic Examination  Result Value Ref Range   WBC, UA None seen 0 - 5 /hpf   RBC, UA 0-2 0 - 2 /hpf   Epithelial Cells (non renal) None seen 0 - 10 /hpf   Bacteria, UA Moderate (A) None seen/Few  Urinalysis, Routine w reflex microscopic (not at [facility])  Result Value Ref Range   Specific Gravity, UA 1.015 1.005 - 1.030   pH, UA 8.5 (H) 5.0 - 7.5   Color, UA Yellow Yellow   Appearance Ur Clear Clear   Leukocytes, UA 1+ (A) Negative   Protein, UA Negative Negative/Trace   Glucose, UA Negative Negative   Ketones, UA Negative Negative   RBC, UA Trace (A) Negative   Bilirubin, UA Negative Negative   Urobilinogen, Ur 0.2 0.2 - 1.0 mg/dL   Nitrite, UA Negative Negative   Microscopic Examination See below:       Assessment & Plan:   Problem List Items Addressed This Visit  Endocrine   Hypothyroidism    The current medical regimen is effective;  continue present plan and medications.         Other   Alcoholism /alcohol abuse (Start)    Still drinking unless reviewed and abuse risk and benefit      Depression    The current medical regimen is effective;  continue present plan and medications.       Relevant Medications   nortriptyline (PAMELOR) 10 MG capsule       Follow up plan: Return in about 6 months (around 05/29/2017) for Physical Exam.

## 2016-11-28 NOTE — Assessment & Plan Note (Signed)
Still drinking unless reviewed and abuse risk and benefit

## 2016-11-28 NOTE — Assessment & Plan Note (Signed)
The current medical regimen is effective;  continue present plan and medications.  

## 2017-01-09 ENCOUNTER — Telehealth: Payer: Self-pay | Admitting: Family Medicine

## 2017-01-09 NOTE — Telephone Encounter (Signed)
Routing to provider  

## 2017-01-09 NOTE — Telephone Encounter (Signed)
I'm not sure what his insurance requires. What does he want to be seen by dermatology for?

## 2017-01-09 NOTE — Telephone Encounter (Signed)
Patient needs a referral to a dermatologist but is not sure if he has to be seen by his PCP first or can go directly to a dermatologist.  Thank you Santiago Glad

## 2017-01-12 NOTE — Telephone Encounter (Signed)
Skin tag on top of patient's leg. It is becoming sore from his pants. It's on top of his legs. Patient said he wanted to wait till he saw Dr. Jeananne Rama on 01/24/2017. I explained to patient that Dr. Jeananne Rama does sometimes removes Skin Tags as long as it's just a skin tag, but he may refer to dermatology depending. Patient understood. Said he'd see what Dr. Jeananne Rama said on the 01/25/16.

## 2017-01-23 ENCOUNTER — Ambulatory Visit: Payer: BLUE CROSS/BLUE SHIELD | Admitting: Family Medicine

## 2017-01-23 ENCOUNTER — Encounter: Payer: Self-pay | Admitting: Family Medicine

## 2017-01-23 ENCOUNTER — Ambulatory Visit (INDEPENDENT_AMBULATORY_CARE_PROVIDER_SITE_OTHER): Payer: BLUE CROSS/BLUE SHIELD | Admitting: Family Medicine

## 2017-01-23 VITALS — BP 135/90 | HR 98 | Temp 98.6°F | Wt 187.0 lb

## 2017-01-23 DIAGNOSIS — L918 Other hypertrophic disorders of the skin: Secondary | ICD-10-CM | POA: Diagnosis not present

## 2017-01-23 DIAGNOSIS — D489 Neoplasm of uncertain behavior, unspecified: Secondary | ICD-10-CM

## 2017-01-23 DIAGNOSIS — L919 Hypertrophic disorder of the skin, unspecified: Secondary | ICD-10-CM | POA: Diagnosis not present

## 2017-01-23 NOTE — Progress Notes (Signed)
   BP 135/90   Pulse 98   Temp 98.6 F (37 C)   Wt 187 lb (84.8 kg)   SpO2 96%   BMI 30.18 kg/m    Subjective:    Patient ID: Earl Baker, male    DOB: 1966/02/06, 51 y.o.   MRN: [id number]  HPI: Earl Baker is a 51 y.o. male  Chief Complaint  Patient presents with  . Skin Tag    x 2-3 years on his right groin, underwear rubs.   Patient presents with irritated skin tag in right groin/gluteal fold. States it has been present for several years, but has been frequently getting caught in his underwear and growing quite a big larger. Painful, irritating. Has not been bleeding. Pt would like the area removed. No history of malignant skin lesions.   Relevant past medical, surgical, family and social history reviewed and updated as indicated. Interim medical history since our last visit reviewed. Allergies and medications reviewed and updated.  Review of Systems  Constitutional: Negative.   HENT: Negative.   Respiratory: Negative.   Cardiovascular: Negative.   Gastrointestinal: Negative.   Genitourinary: Negative.   Musculoskeletal: Negative.   Skin:       Irritated lesion right groin  Neurological: Negative.   Psychiatric/Behavioral: Negative.     Per HPI unless specifically indicated above     Objective:    BP 135/90   Pulse 98   Temp 98.6 F (37 C)   Wt 187 lb (84.8 kg)   SpO2 96%   BMI 30.18 kg/m   Wt Readings from Last 3 Encounters:  01/23/17 187 lb (84.8 kg)  11/28/16 195 lb (88.5 kg)  09/28/16 194 lb 4.8 oz (88.1 kg)    Physical Exam  Constitutional: He is oriented to person, place, and time. He appears well-developed and well-nourished. No distress.  HENT:  Head: Atraumatic.  Eyes: Conjunctivae are normal. Pupils are equal, round, and reactive to light.  Neck: Normal range of motion. Neck supple.  Cardiovascular: Normal rate and normal heart sounds.   Pulmonary/Chest: Effort normal and breath sounds normal. No respiratory distress.    Musculoskeletal: Normal range of motion.  Neurological: He is alert and oriented to person, place, and time.  Skin: Skin is warm and dry.  Penduculated, multi-lobular lesion in right inner thigh/gluteal fold.   Nursing note and vitals reviewed.  Procedure note: Shave excision, neoplasm of uncertain behavior right gluteal fold Procedure was discussed with patient in detail, and all questions were adequately answered. Area was prepped in semi-sterile fashion. 2% lidocaine with epinephrine was infiltrated to numb surrounding area. Dermablade was used to shave off entire lesion at base. Bleeding was controlled with pressure and silver nitrate stick. Wound was dressed with neosporin and a patch bandage. Wound care was discussed at length. Patient tolerated procedure well with no immediate complications noted. Return precautions given.      Assessment & Plan:   Problem List Items Addressed This Visit    None    Visit Diagnoses    Neoplasm of uncertain behavior    -  Primary   Lesion removed in office without complication. Sent to pathology, await report. Wound care discussed.    Relevant Orders   Pathology Report       Follow up plan: Return if symptoms worsen or fail to improve.

## 2017-01-24 ENCOUNTER — Ambulatory Visit: Payer: BLUE CROSS/BLUE SHIELD | Admitting: Family Medicine

## 2017-01-24 NOTE — Patient Instructions (Signed)
Follow up as needed

## 2017-01-27 LAB — PATHOLOGY

## 2017-03-17 ENCOUNTER — Encounter: Payer: Self-pay | Admitting: Family Medicine

## 2017-03-17 ENCOUNTER — Ambulatory Visit (INDEPENDENT_AMBULATORY_CARE_PROVIDER_SITE_OTHER): Payer: BLUE CROSS/BLUE SHIELD | Admitting: Family Medicine

## 2017-03-17 VITALS — BP 110/69 | HR 75 | Temp 98.4°F | Ht 66.0 in | Wt 182.6 lb

## 2017-03-17 DIAGNOSIS — S39012A Strain of muscle, fascia and tendon of lower back, initial encounter: Secondary | ICD-10-CM | POA: Diagnosis not present

## 2017-03-17 MED ORDER — CYCLOBENZAPRINE HCL 10 MG PO TABS: 10.0000 mg | ORAL_TABLET | Freq: Three times a day (TID) | ORAL | 0 refills | Status: DC | PRN

## 2017-03-17 NOTE — Patient Instructions (Signed)
Follow up as needed

## 2017-03-17 NOTE — Progress Notes (Signed)
   BP 110/69 (BP Location: Right Arm, Patient Position: Sitting, Cuff Size: Normal)   Pulse 75   Temp 98.4 F (36.9 C)   Ht 5\' 6"  (1.676 m)   Wt 182 lb 9.6 oz (82.8 kg)   SpO2 98%   BMI 29.47 kg/m    Subjective:    Patient ID: Earl Baker, male    DOB: Apr 03, 1966, 51 y.o.   MRN: [id number]  HPI: Earl Baker is a 51 y.o. male  Chief Complaint  Patient presents with  . Back Pain    x's 2 weeks. Was all the way across lower back but now more on R side.    Patient with 2 week history of b/l lower back pain that has now resolved on the left. Has been working out and lifting heavy objects at work lately. Hx of back strains that felt very similar to this, but no previous injuries. The pain goes from right lower back and wraps down toward hip. Resolves at rest, aggravated by bending and weight bearing. Denies radiation down legs, numbness, tingling, incontinence, fever, chills. Taking meloxicam daily for headaches and states this hasn't helped much. Lidoderm patches have helped some.   Relevant past medical, surgical, family and social history reviewed and updated as indicated. Interim medical history since our last visit reviewed. Allergies and medications reviewed and updated.  Review of Systems  Constitutional: Negative.   HENT: Negative.   Eyes: Negative.   Respiratory: Negative.   Cardiovascular: Negative.   Gastrointestinal: Negative.   Genitourinary: Negative.   Musculoskeletal: Positive for back pain.  Neurological: Negative.   Psychiatric/Behavioral: Negative.     Per HPI unless specifically indicated above     Objective:    BP 110/69 (BP Location: Right Arm, Patient Position: Sitting, Cuff Size: Normal)   Pulse 75   Temp 98.4 F (36.9 C)   Ht 5\' 6"  (1.676 m)   Wt 182 lb 9.6 oz (82.8 kg)   SpO2 98%   BMI 29.47 kg/m   Wt Readings from Last 3 Encounters:  03/17/17 182 lb 9.6 oz (82.8 kg)  01/23/17 187 lb (84.8 kg)  11/28/16 195 lb (88.5 kg)      Physical Exam  Constitutional: He is oriented to person, place, and time. He appears well-developed and well-nourished. No distress.  HENT:  Head: Atraumatic.  Eyes: Conjunctivae are normal. Pupils are equal, round, and reactive to light.  Neck: Normal range of motion. Neck supple.  Cardiovascular: Normal rate, normal heart sounds and intact distal pulses.   Pulmonary/Chest: Effort normal and breath sounds normal. No respiratory distress.  Musculoskeletal: He exhibits no edema or deformity.  - SLR Pain with back extension and left side bend Strength full and equal b/l LEs  Neurological: He is alert and oriented to person, place, and time.  Skin: Skin is warm and dry.  Psychiatric: He has a normal mood and affect. His behavior is normal.  Nursing note and vitals reviewed.     Assessment & Plan:   Problem List Items Addressed This Visit    None    Visit Diagnoses    Strain of lumbar region, initial encounter    -  Primary   Flexeril sent, precautions reviewed. Pt no longer drinking alcohol. Cont meloxicam and lidoderm patches prn. Epsom salt soaks, gentle stretches, rest       Follow up plan: Return for as scheduled.

## 2017-04-07 ENCOUNTER — Ambulatory Visit (INDEPENDENT_AMBULATORY_CARE_PROVIDER_SITE_OTHER): Payer: BLUE CROSS/BLUE SHIELD | Admitting: Family Medicine

## 2017-04-07 ENCOUNTER — Encounter: Payer: Self-pay | Admitting: Family Medicine

## 2017-04-07 VITALS — BP 102/68 | HR 81 | Temp 98.3°F | Wt 182.6 lb

## 2017-04-07 DIAGNOSIS — G8929 Other chronic pain: Secondary | ICD-10-CM | POA: Diagnosis not present

## 2017-04-07 DIAGNOSIS — M545 Low back pain: Secondary | ICD-10-CM

## 2017-04-07 NOTE — Progress Notes (Signed)
   BP 102/68 (BP Location: Right Arm, Patient Position: Sitting, Cuff Size: Normal)   Pulse 81   Temp 98.3 F (36.8 C)   Wt 182 lb 9.6 oz (82.8 kg)   SpO2 97%   BMI 29.47 kg/m    Subjective:    Patient ID: Earl Baker, male    DOB: 1966-01-25, 51 y.o.   MRN: [id number]  HPI: Earl Baker is a 51 y.o. male  Chief Complaint  Patient presents with  . Back Pain    Right side. Never really got better from 03/17/2017   Patient presents for back pain f/u. Did not see any real improvement with the flexeril. Cut back on weight lifting and has been doing stretches as well, and feels like pain may be worse now. Denies any radiation down legs, weakness,numbness, fever, chills, incontinence. No hx of kidney stones, no urinary sxs. Still taking meloxicam daily for HAs but isn't noticing much benefit from this either.   Relevant past medical, surgical, family and social history reviewed and updated as indicated. Interim medical history since our last visit reviewed. Allergies and medications reviewed and updated.  Review of Systems  Constitutional: Negative.   HENT: Negative.   Respiratory: Negative.   Cardiovascular: Negative.   Gastrointestinal: Negative.   Genitourinary: Negative.   Musculoskeletal: Positive for back pain.  Skin: Negative.   Neurological: Negative.   Psychiatric/Behavioral: Negative.     Per HPI unless specifically indicated above     Objective:    BP 102/68 (BP Location: Right Arm, Patient Position: Sitting, Cuff Size: Normal)   Pulse 81   Temp 98.3 F (36.8 C)   Wt 182 lb 9.6 oz (82.8 kg)   SpO2 97%   BMI 29.47 kg/m   Wt Readings from Last 3 Encounters:  04/07/17 182 lb 9.6 oz (82.8 kg)  03/17/17 182 lb 9.6 oz (82.8 kg)  01/23/17 187 lb (84.8 kg)    Physical Exam  Constitutional: He is oriented to person, place, and time. He appears well-developed and well-nourished.  HENT:  Head: Atraumatic.  Eyes: Conjunctivae are normal. Pupils are  equal, round, and reactive to light.  Neck: Normal range of motion. Neck supple.  Cardiovascular: Normal rate and normal heart sounds.   Pulmonary/Chest: Effort normal and breath sounds normal. No respiratory distress.  Abdominal: Soft. Bowel sounds are normal. There is no tenderness.  Musculoskeletal: Normal range of motion. He exhibits tenderness (minimal ttp over lumbar paraspinal muscles). He exhibits no edema or deformity.  Neurological: He is alert and oriented to person, place, and time.  Skin: Skin is warm and dry.  Psychiatric: He has a normal mood and affect. His behavior is normal.  Nursing note and vitals reviewed.     Assessment & Plan:   Problem List Items Addressed This Visit    None    Visit Diagnoses    Chronic right-sided low back pain without sciatica    -  Primary   U/A + for minimal RBCs, but low suspicion for nephrolithiasis here. Push fluids in case. PT referral placed, lumbar x-ray ordered. Continue stretches, meloxicam   Relevant Orders   Ambulatory referral to Physical Therapy   DG Lumbar Spine Complete (Completed)   UA/M w/rflx Culture, Routine (Completed)       Follow up plan: Return if symptoms worsen or fail to improve.

## 2017-04-08 LAB — UA/M W/RFLX CULTURE, ROUTINE
Bilirubin, UA: NEGATIVE
Glucose, UA: NEGATIVE
Ketones, UA: NEGATIVE
LEUKOCYTES UA: NEGATIVE
Nitrite, UA: NEGATIVE
Protein, UA: NEGATIVE
SPEC GRAV UA: 1.02 (ref 1.005–1.030)
Urobilinogen, Ur: 0.2 mg/dL (ref 0.2–1.0)
pH, UA: 6 (ref 5.0–7.5)

## 2017-04-08 LAB — MICROSCOPIC EXAMINATION
Bacteria, UA: NONE SEEN
RBC MICROSCOPIC, UA: NONE SEEN /HPF (ref 0–?)
WBC, UA: NONE SEEN /hpf (ref 0–?)

## 2017-04-11 ENCOUNTER — Ambulatory Visit
Admission: RE | Admit: 2017-04-11 | Discharge: 2017-04-11 | Disposition: A | Discharge status: Home / Self Care | Payer: BLUE CROSS/BLUE SHIELD | Source: Ambulatory Visit | Attending: Family Medicine | Admitting: Family Medicine

## 2017-04-11 DIAGNOSIS — M5136 Other intervertebral disc degeneration, lumbar region: Secondary | ICD-10-CM | POA: Diagnosis not present

## 2017-04-11 DIAGNOSIS — G8929 Other chronic pain: Secondary | ICD-10-CM

## 2017-04-11 DIAGNOSIS — M545 Low back pain: Secondary | ICD-10-CM | POA: Diagnosis not present

## 2017-04-11 IMAGING — DX DG LUMBAR SPINE COMPLETE 4+V
6 series · 6 of 6 positions shown · non-contrast
Comparison: [DATE]

CLINICAL DATA: Low back pain.

EXAM:
LUMBAR SPINE - COMPLETE 4+ VIEW

[l-spine ap]
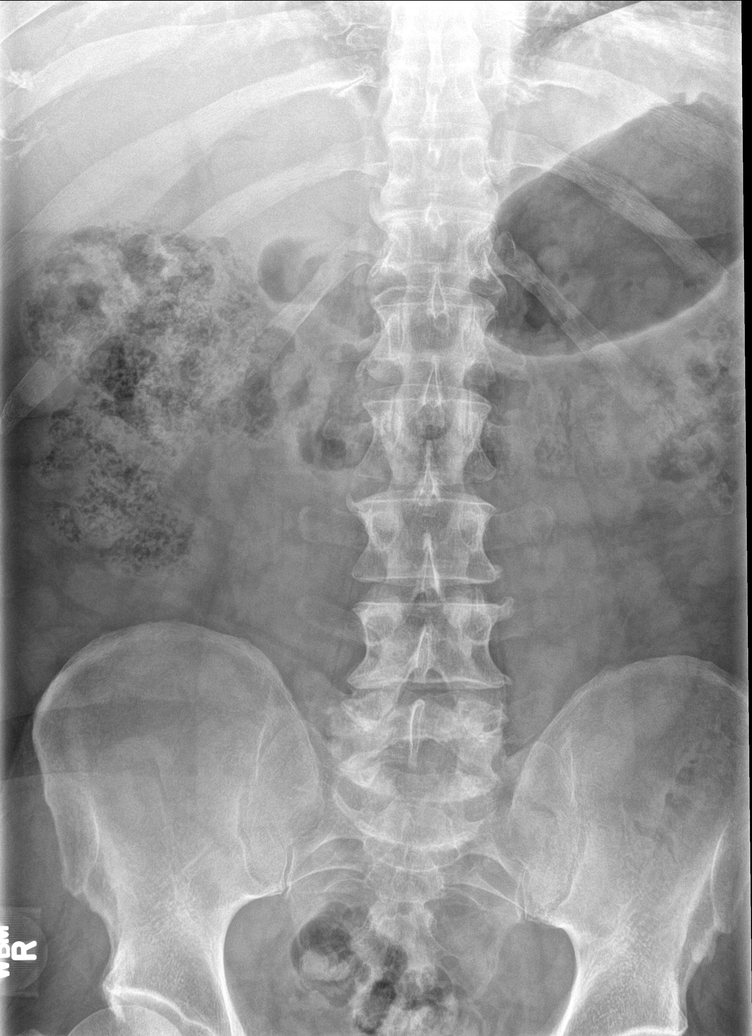

[l-spine obl (1 of 3)]
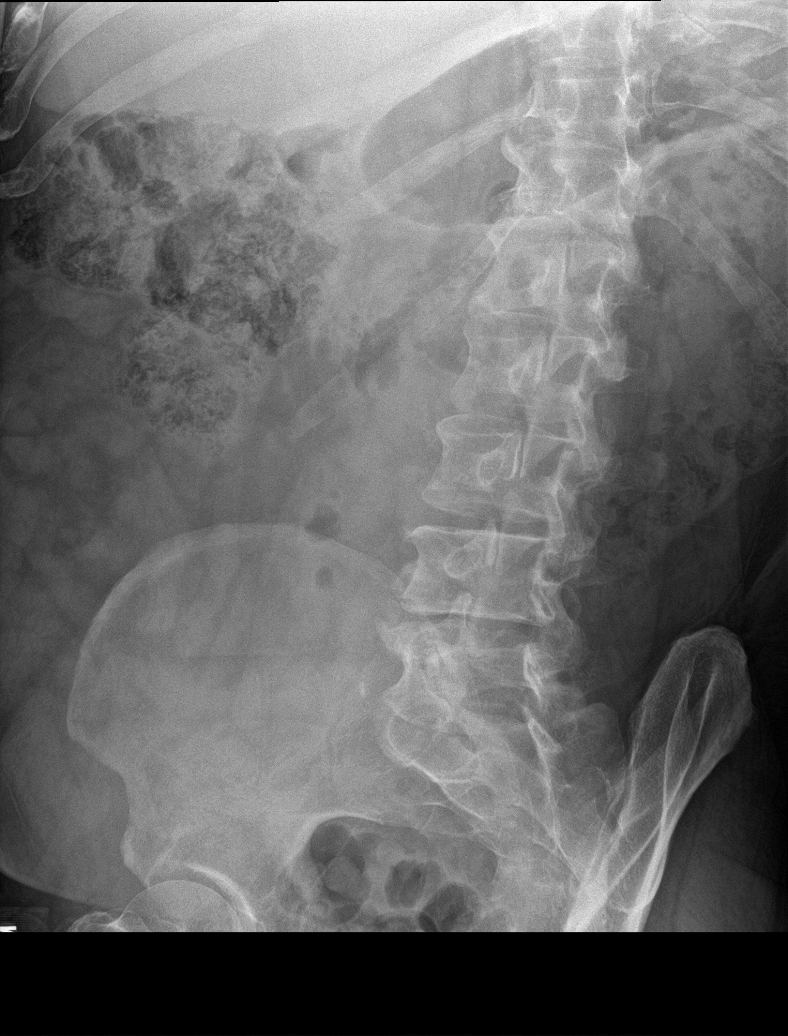

[l-spine obl (2 of 3)]
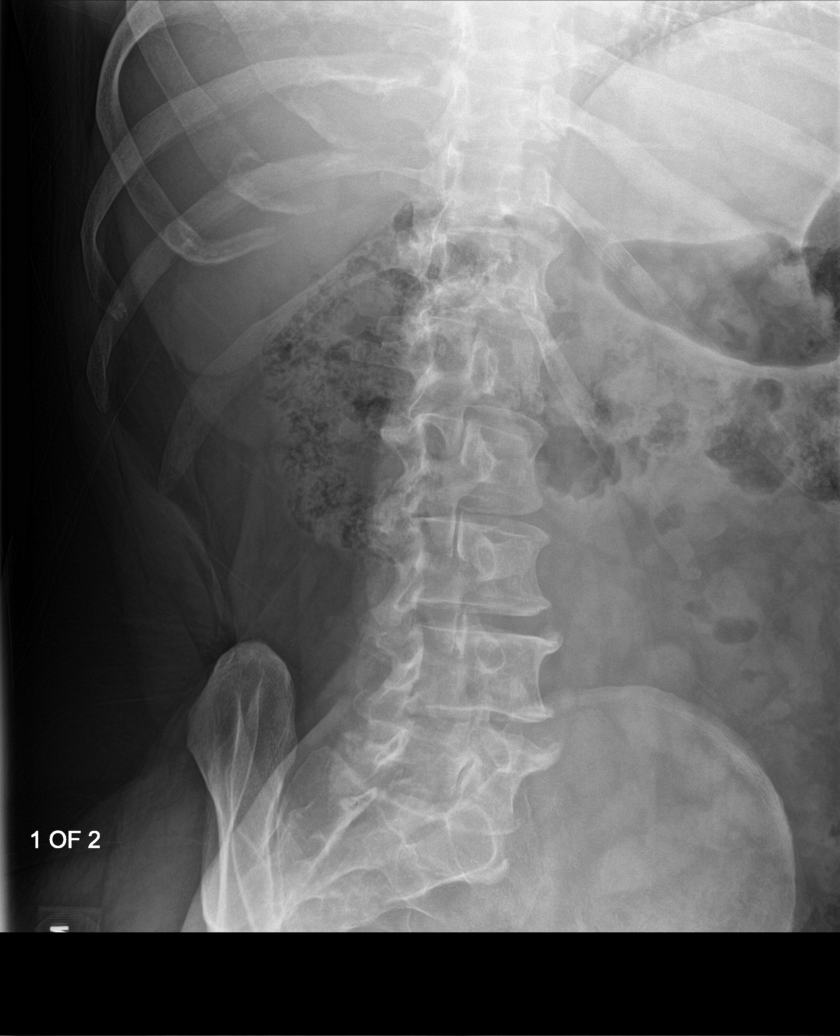

[l-spine lat]
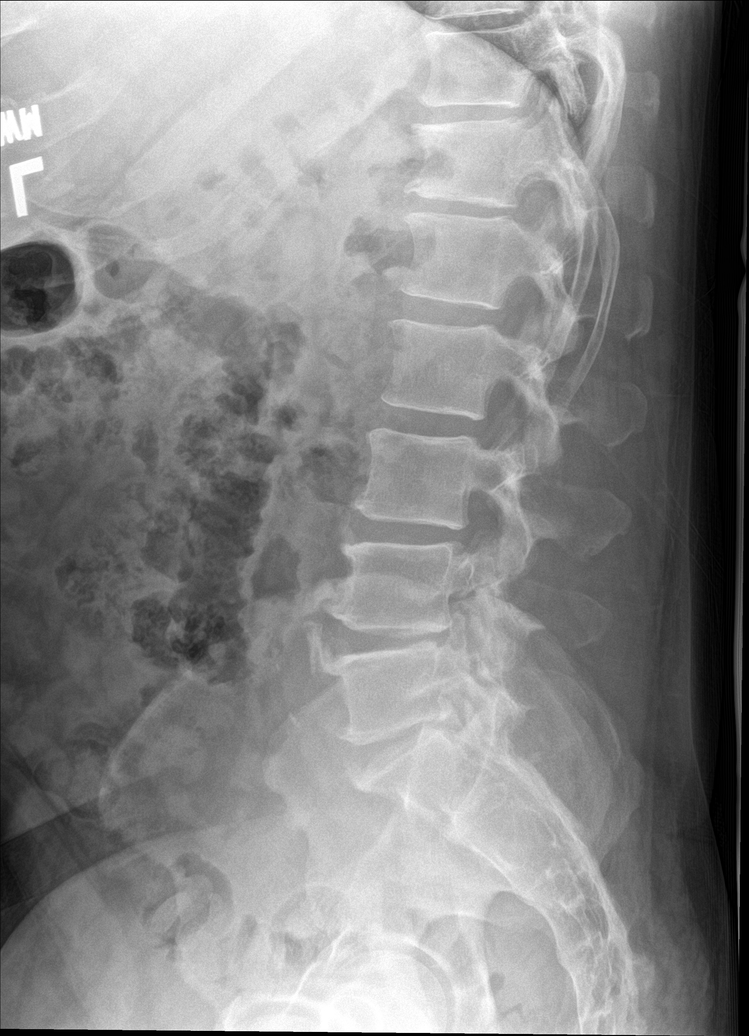

[l-spine spot]
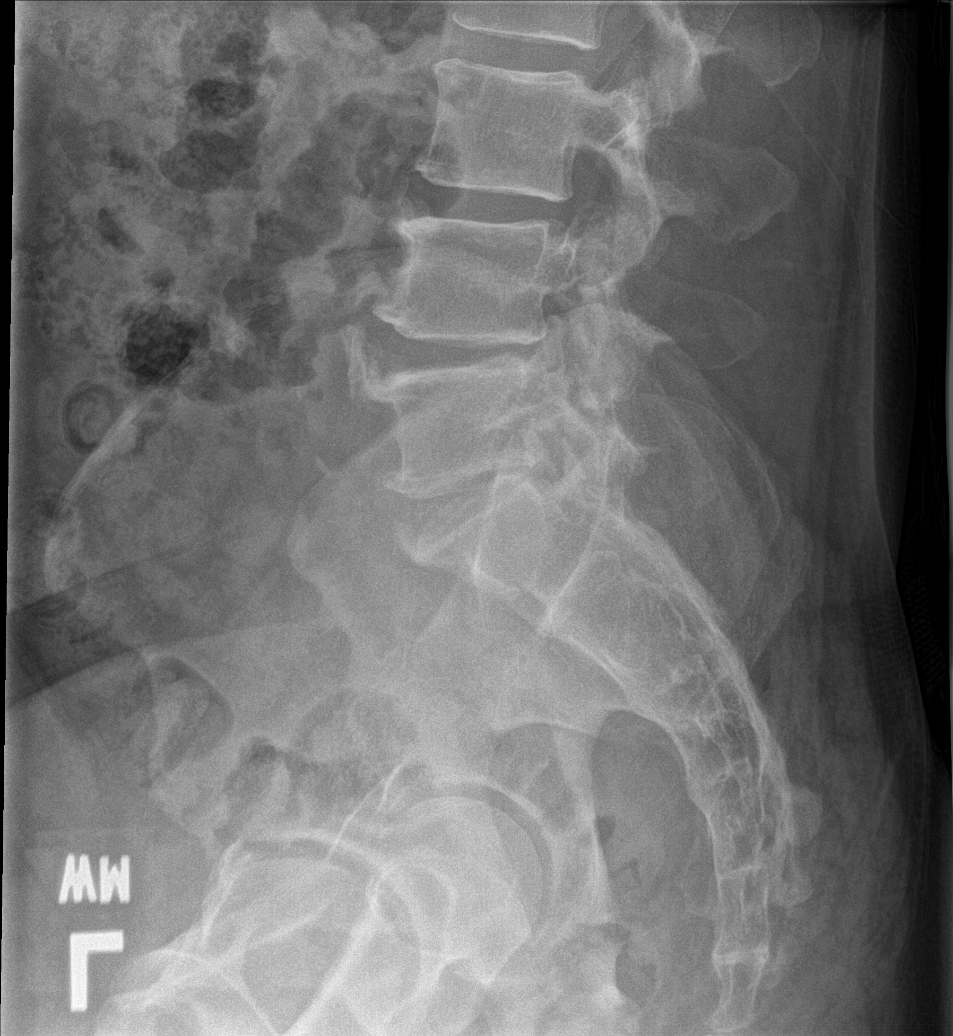

[l-spine obl (3 of 3)]
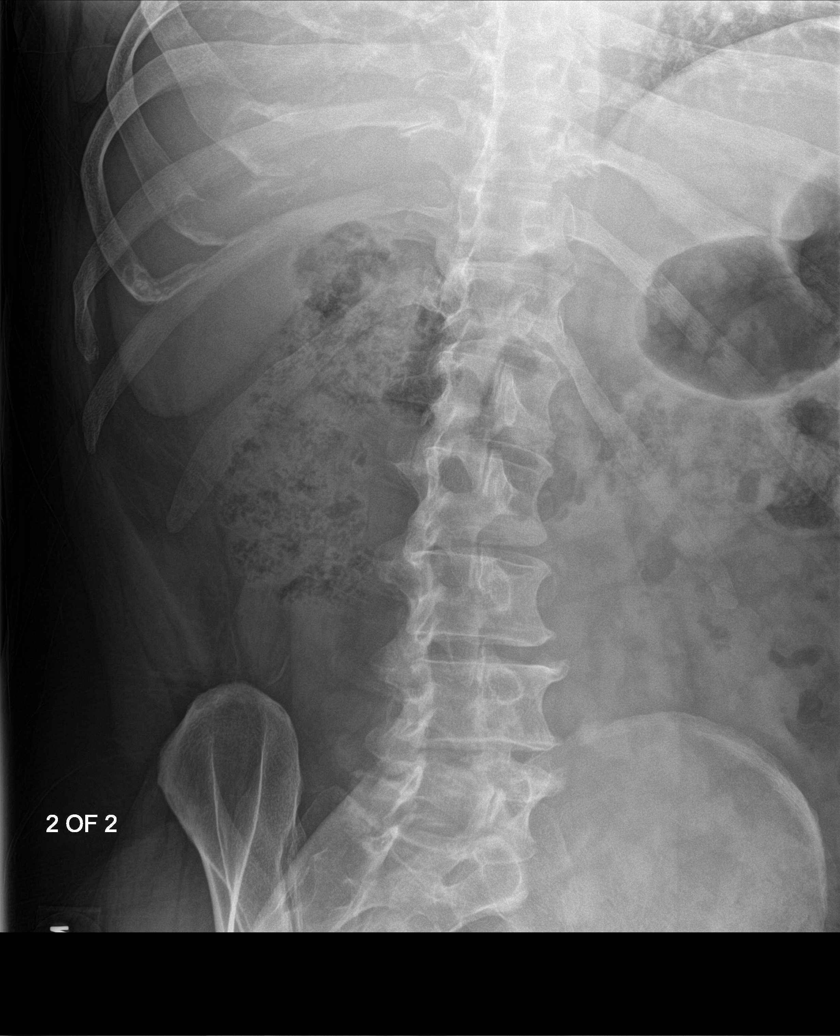

[6 of 6 positions shown; findings below may reference images not displayed]

FINDINGS: Degenerative spurring noted in the lower lumbar spine anteriorly.
Slight disc space narrowing at L5-S1. Normal alignment. No fracture.
SI joints are symmetric and unremarkable.
IMPRESSION: Degenerative changes in the lower lumbar spine.  No acute findings.

## 2017-04-13 NOTE — Patient Instructions (Signed)
Follow up as needed

## 2017-05-29 ENCOUNTER — Ambulatory Visit (INDEPENDENT_AMBULATORY_CARE_PROVIDER_SITE_OTHER): Payer: BLUE CROSS/BLUE SHIELD | Admitting: Family Medicine

## 2017-05-29 ENCOUNTER — Encounter: Payer: Self-pay | Admitting: Family Medicine

## 2017-05-29 VITALS — BP 129/86 | HR 68 | Ht 66.0 in | Wt 185.0 lb

## 2017-05-29 DIAGNOSIS — Z125 Encounter for screening for malignant neoplasm of prostate: Secondary | ICD-10-CM

## 2017-05-29 DIAGNOSIS — F1020 Alcohol dependence, uncomplicated: Secondary | ICD-10-CM

## 2017-05-29 DIAGNOSIS — IMO0001 Reserved for inherently not codable concepts without codable children: Secondary | ICD-10-CM

## 2017-05-29 DIAGNOSIS — F325 Major depressive disorder, single episode, in full remission: Secondary | ICD-10-CM | POA: Diagnosis not present

## 2017-05-29 DIAGNOSIS — F1910 Other psychoactive substance abuse, uncomplicated: Secondary | ICD-10-CM | POA: Diagnosis not present

## 2017-05-29 DIAGNOSIS — Z1329 Encounter for screening for other suspected endocrine disorder: Secondary | ICD-10-CM

## 2017-05-29 DIAGNOSIS — Z0000 Encounter for general adult medical examination without abnormal findings: Secondary | ICD-10-CM | POA: Diagnosis not present

## 2017-05-29 DIAGNOSIS — E039 Hypothyroidism, unspecified: Secondary | ICD-10-CM

## 2017-05-29 DIAGNOSIS — Z13220 Encounter for screening for lipoid disorders: Secondary | ICD-10-CM | POA: Diagnosis not present

## 2017-05-29 LAB — URINALYSIS, ROUTINE W REFLEX MICROSCOPIC
Bilirubin, UA: NEGATIVE
Glucose, UA: NEGATIVE
KETONES UA: NEGATIVE
Nitrite, UA: NEGATIVE
Protein, UA: NEGATIVE
SPEC GRAV UA: 1.01 (ref 1.005–1.030)
Urobilinogen, Ur: 0.2 mg/dL (ref 0.2–1.0)
pH, UA: 7 (ref 5.0–7.5)

## 2017-05-29 LAB — MICROSCOPIC EXAMINATION: BACTERIA UA: NONE SEEN

## 2017-05-29 MED ORDER — DISULFIRAM 250 MG PO TABS: 250.0000 mg | ORAL_TABLET | Freq: Every day | ORAL | 4 refills | Status: DC

## 2017-05-29 MED ORDER — FLUOXETINE HCL 20 MG PO TABS: 20.0000 mg | ORAL_TABLET | Freq: Every day | ORAL | 4 refills | Status: DC

## 2017-05-29 MED ORDER — LEVOTHYROXINE SODIUM 75 MCG PO TABS: 75.0000 ug | ORAL_TABLET | Freq: Every day | ORAL | 4 refills | Status: DC

## 2017-05-29 NOTE — Progress Notes (Signed)
BP 129/86   Pulse 68   Ht 5\' 6"  (1.676 m)   Wt 185 lb (83.9 kg)   SpO2 97%   BMI 29.86 kg/m    Subjective:    Patient ID: Earl Baker, male    DOB: 07/16/1966, 51 y.o.   MRN: [id number]  HPI: Earl Baker is a 51 y.o. male  Chief Complaint  Patient presents with  . Annual Exam   Patient all in all doing well taking medications without problems. Uses Antabuse on a when necessary basis when his got a work still has occasional alcohol use but limited abuse. Taking thyroid medications without problems or side effects Taking fluoxetine without problems helps with nerve issues. Relevant past medical, surgical, family and social history reviewed and updated as indicated. Interim medical history since our last visit reviewed. Allergies and medications reviewed and updated.  Review of Systems  Constitutional: Negative.   HENT: Negative.   Eyes: Negative.   Respiratory: Negative.   Cardiovascular: Negative.   Gastrointestinal: Negative.   Endocrine: Negative.   Genitourinary: Negative.   Musculoskeletal: Negative.   Skin: Negative.   Allergic/Immunologic: Negative.   Neurological: Negative.   Hematological: Negative.   Psychiatric/Behavioral: Negative.     Per HPI unless specifically indicated above     Objective:    BP 129/86   Pulse 68   Ht 5\' 6"  (1.676 m)   Wt 185 lb (83.9 kg)   SpO2 97%   BMI 29.86 kg/m   Wt Readings from Last 3 Encounters:  05/29/17 185 lb (83.9 kg)  04/07/17 182 lb 9.6 oz (82.8 kg)  03/17/17 182 lb 9.6 oz (82.8 kg)    Physical Exam  Constitutional: He is oriented to person, place, and time. He appears well-developed and well-nourished.  HENT:  Head: Normocephalic and atraumatic.  Right Ear: External ear normal.  Left Ear: External ear normal.  Eyes: Conjunctivae and EOM are normal. Pupils are equal, round, and reactive to light.  Neck: Normal range of motion. Neck supple.  Cardiovascular: Normal rate, regular rhythm,  normal heart sounds and intact distal pulses.   Pulmonary/Chest: Effort normal and breath sounds normal.  Abdominal: Soft. Bowel sounds are normal. There is no splenomegaly or hepatomegaly.  Genitourinary: Rectum normal, prostate normal and penis normal.  Musculoskeletal: Normal range of motion.  Neurological: He is alert and oriented to person, place, and time. He has normal reflexes.  Skin: No rash noted. No erythema.  Psychiatric: He has a normal mood and affect. His behavior is normal. Judgment and thought content normal.    Results for orders placed or performed in visit on 04/07/17  Microscopic Examination  Result Value Ref Range   WBC, UA None seen 0 - 5 /hpf   RBC, UA None seen 0 - 2 /hpf   Epithelial Cells (non renal) CANCELED    Bacteria, UA None seen None seen/Few  UA/M w/rflx Culture, Routine  Result Value Ref Range   Specific Gravity, UA 1.020 1.005 - 1.030   pH, UA 6.0 5.0 - 7.5   Color, UA Yellow Yellow   Appearance Ur Clear Clear   Leukocytes, UA Negative Negative   Protein, UA Negative Negative/Trace   Glucose, UA Negative Negative   Ketones, UA Negative Negative   RBC, UA Trace (A) Negative   Bilirubin, UA Negative Negative   Urobilinogen, Ur 0.2 0.2 - 1.0 mg/dL   Nitrite, UA Negative Negative   Microscopic Examination See below:  Assessment & Plan:   Problem List Items Addressed This Visit      Endocrine   Hypothyroidism - Primary    The current medical regimen is effective;  continue present plan and medications.       Relevant Medications   levothyroxine (SYNTHROID, LEVOTHROID) 75 MCG tablet   Other Relevant Orders   TSH     Other   Alcoholism /alcohol abuse ([place])    Stable for now      Relevant Medications   disulfiram (ANTABUSE) 250 MG tablet   Substance abuse   Relevant Orders   CBC with Differential/Platelet   Comprehensive metabolic panel   Urinalysis, Routine w reflex microscopic   Depression    The current medical regimen  is effective;  continue present plan and medications.       Relevant Medications   FLUoxetine (PROZAC) 20 MG tablet    Other Visit Diagnoses    Annual physical exam       Screening cholesterol level       Relevant Orders   Lipid panel   Thyroid disorder screen       Prostate cancer screening       Relevant Orders   PSA       Follow up plan: Return in about 6 months (around 11/28/2017) for BMP.

## 2017-05-29 NOTE — Assessment & Plan Note (Signed)
Stable for now

## 2017-05-29 NOTE — Assessment & Plan Note (Signed)
The current medical regimen is effective;  continue present plan and medications.  

## 2017-05-30 ENCOUNTER — Encounter: Payer: Self-pay | Admitting: Family Medicine

## 2017-05-30 LAB — CBC WITH DIFFERENTIAL/PLATELET
BASOS ABS: 0.1 10*3/uL (ref 0.0–0.2)
Basos: 1 %
EOS (ABSOLUTE): 0.3 10*3/uL (ref 0.0–0.4)
Eos: 3 %
Hematocrit: 44.1 % (ref 37.5–51.0)
Hemoglobin: 14.5 g/dL (ref 13.0–17.7)
IMMATURE GRANS (ABS): 0 10*3/uL (ref 0.0–0.1)
IMMATURE GRANULOCYTES: 0 %
LYMPHS: 43 %
Lymphocytes Absolute: 3.8 10*3/uL — ABNORMAL HIGH (ref 0.7–3.1)
MCH: 28.7 pg (ref 26.6–33.0)
MCHC: 32.9 g/dL (ref 31.5–35.7)
MCV: 87 fL (ref 79–97)
MONOS ABS: 0.8 10*3/uL (ref 0.1–0.9)
Monocytes: 9 %
NEUTROS PCT: 44 %
Neutrophils Absolute: 4 10*3/uL (ref 1.4–7.0)
PLATELETS: 201 10*3/uL (ref 150–379)
RBC: 5.06 x10E6/uL (ref 4.14–5.80)
RDW: 14.6 % (ref 12.3–15.4)
WBC: 9 10*3/uL (ref 3.4–10.8)

## 2017-05-30 LAB — COMPREHENSIVE METABOLIC PANEL
A/G RATIO: 1.8 (ref 1.2–2.2)
ALT: 30 IU/L (ref 0–44)
AST: 26 IU/L (ref 0–40)
Albumin: 4.5 g/dL (ref 3.5–5.5)
Alkaline Phosphatase: 83 IU/L (ref 39–117)
BUN/Creatinine Ratio: 23 — ABNORMAL HIGH (ref 9–20)
BUN: 21 mg/dL (ref 6–24)
Bilirubin Total: 0.5 mg/dL (ref 0.0–1.2)
CALCIUM: 9.5 mg/dL (ref 8.7–10.2)
CHLORIDE: 99 mmol/L (ref 96–106)
CO2: 22 mmol/L (ref 20–29)
Creatinine, Ser: 0.92 mg/dL (ref 0.76–1.27)
GFR calc Af Amer: 113 mL/min/{1.73_m2} (ref >59)
GFR, EST NON AFRICAN AMERICAN: 97 mL/min/{1.73_m2} (ref >59)
GLUCOSE: 92 mg/dL (ref 65–99)
Globulin, Total: 2.5 g/dL (ref 1.5–4.5)
POTASSIUM: 4.8 mmol/L (ref 3.5–5.2)
Sodium: 138 mmol/L (ref 134–144)
TOTAL PROTEIN: 7 g/dL (ref 6.0–8.5)

## 2017-05-30 LAB — LIPID PANEL
CHOL/HDL RATIO: 4.3 ratio (ref 0.0–5.0)
Cholesterol, Total: 196 mg/dL (ref 100–199)
HDL: 46 mg/dL (ref >39)
LDL Calculated: 111 mg/dL — ABNORMAL HIGH (ref 0–99)
TRIGLYCERIDES: 194 mg/dL — AB (ref 0–149)
VLDL Cholesterol Cal: 39 mg/dL (ref 5–40)

## 2017-05-30 LAB — PSA: Prostate Specific Ag, Serum: 1.1 ng/mL (ref 0.0–4.0)

## 2017-05-30 LAB — TSH: TSH: 3.89 u[IU]/mL (ref 0.450–4.500)

## 2017-06-21 ENCOUNTER — Other Ambulatory Visit: Payer: Self-pay | Admitting: Family Medicine

## 2017-06-26 ENCOUNTER — Telehealth: Payer: Self-pay

## 2017-06-26 NOTE — Telephone Encounter (Signed)
Called pt in regards to insurance form he dropped off. Need to know if pt was fasting during last OV on 05/29/17. VM left requesting callback.

## 2017-06-26 NOTE — Telephone Encounter (Signed)
Spoke with patient. Pt was fasting, form completed. Pt aware. Will come and pickup/sign.

## 2017-06-28 NOTE — Telephone Encounter (Signed)
Patient picked up paperwork. Original copy faxed.

## 2017-11-28 ENCOUNTER — Ambulatory Visit: Payer: BLUE CROSS/BLUE SHIELD | Admitting: Family Medicine

## 2018-01-08 ENCOUNTER — Other Ambulatory Visit: Payer: Self-pay | Admitting: Unknown Physician Specialty

## 2018-01-22 DIAGNOSIS — H40013 Open angle with borderline findings, low risk, bilateral: Secondary | ICD-10-CM | POA: Diagnosis not present

## 2018-02-06 ENCOUNTER — Ambulatory Visit (INDEPENDENT_AMBULATORY_CARE_PROVIDER_SITE_OTHER): Payer: BLUE CROSS/BLUE SHIELD | Admitting: Family Medicine

## 2018-02-06 ENCOUNTER — Encounter: Payer: Self-pay | Admitting: Family Medicine

## 2018-02-06 DIAGNOSIS — R0602 Shortness of breath: Secondary | ICD-10-CM | POA: Diagnosis not present

## 2018-02-06 NOTE — Assessment & Plan Note (Signed)
Unknown cause of shortness of breath most likely secondary to reflux acid burning patient larynx area. If Persists or becomes problematic will need to recheck. Discussed use of Prilosec

## 2018-02-06 NOTE — Progress Notes (Signed)
BP 120/80   Pulse 75   Wt 191 lb (86.6 kg)   SpO2 98%   BMI 30.83 kg/m    Subjective:    Patient ID: Earl Baker, male    DOB: [birth year], 51 y.o.   MRN: [id number]  HPI: Earl Baker is a 52 y.o. male  Chief Complaint  Patient presents with  . Acute Visit  5 days ago patient with complaints of some shortness of breath type sensation came on for about 10 minutes had trouble catching his breath was sitting on the sofa with his girlfriend which came redness face with some rapid heartbeat no chest pain chest tightness or other type issues otherwise doing well had not been drinking no noticed reflux or other type issues. Otherwise doing well no complaints from medications and stable  Relevant past medical, surgical, family and social history reviewed and updated as indicated. Interim medical history since our last visit reviewed. Allergies and medications reviewed and updated.  Review of Systems  Constitutional: Negative.   Respiratory: Negative.   Cardiovascular: Negative.   Gastrointestinal: Negative.     Per HPI unless specifically indicated above     Objective:    BP 120/80   Pulse 75   Wt 191 lb (86.6 kg)   SpO2 98%   BMI 30.83 kg/m   Wt Readings from Last 3 Encounters:  02/06/18 191 lb (86.6 kg)  05/29/17 185 lb (83.9 kg)  04/07/17 182 lb 9.6 oz (82.8 kg)    Physical Exam  Constitutional: He is oriented to person, place, and time. He appears well-developed and well-nourished.  HENT:  Head: Normocephalic and atraumatic.  Eyes: Conjunctivae and EOM are normal.  Neck: Normal range of motion.  Cardiovascular: Normal rate, regular rhythm and normal heart sounds.  Pulmonary/Chest: Effort normal and breath sounds normal.  Musculoskeletal: Normal range of motion.  Neurological: He is alert and oriented to person, place, and time.  Skin: No erythema.  Psychiatric: He has a normal mood and affect. His behavior is normal. Judgment and thought content  normal.  EKG normal sinus rhythm with no acute changes  Results for orders placed or performed in visit on 05/29/17  Microscopic Examination  Result Value Ref Range   WBC, UA 0-5 0 - 5 /hpf   RBC, UA 0-2 0 - 2 /hpf   Epithelial Cells (non renal) 0-10 0 - 10 /hpf   Bacteria, UA None seen None seen/Few  CBC with Differential/Platelet  Result Value Ref Range   WBC 9.0 3.4 - 10.8 x10E3/uL   RBC 5.06 4.14 - 5.80 x10E6/uL   Hemoglobin 14.5 13.0 - 17.7 g/dL   Hematocrit 44.1 37.5 - 51.0 %   MCV 87 79 - 97 fL   MCH 28.7 26.6 - 33.0 pg   MCHC 32.9 31.5 - 35.7 g/dL   RDW 14.6 12.3 - 15.4 %   Platelets 201 150 - 379 x10E3/uL   Neutrophils 44 Not Estab. %   Lymphs 43 Not Estab. %   Monocytes 9 Not Estab. %   Eos 3 Not Estab. %   Basos 1 Not Estab. %   Neutrophils Absolute 4.0 1.4 - 7.0 x10E3/uL   Lymphocytes Absolute 3.8 (H) 0.7 - 3.1 x10E3/uL   Monocytes Absolute 0.8 0.1 - 0.9 x10E3/uL   EOS (ABSOLUTE) 0.3 0.0 - 0.4 x10E3/uL   Basophils Absolute 0.1 0.0 - 0.2 x10E3/uL   Immature Granulocytes 0 Not Estab. %   Immature Grans (Abs) 0.0 0.0 - 0.1 x10E3/uL  Comprehensive metabolic panel  Result Value Ref Range   Glucose 92 65 - 99 mg/dL   BUN 21 6 - 24 mg/dL   Creatinine, Ser 0.92 0.76 - 1.27 mg/dL   GFR calc non Af Amer 97 >59 mL/min/1.73   GFR calc Af Amer 113 >59 mL/min/1.73   BUN/Creatinine Ratio 23 (H) 9 - 20   Sodium 138 134 - 144 mmol/L   Potassium 4.8 3.5 - 5.2 mmol/L   Chloride 99 96 - 106 mmol/L   CO2 22 20 - 29 mmol/L   Calcium 9.5 8.7 - 10.2 mg/dL   Total Protein 7.0 6.0 - 8.5 g/dL   Albumin 4.5 3.5 - 5.5 g/dL   Globulin, Total 2.5 1.5 - 4.5 g/dL   Albumin/Globulin Ratio 1.8 1.2 - 2.2   Bilirubin Total 0.5 0.0 - 1.2 mg/dL   Alkaline Phosphatase 83 39 - 117 IU/L   AST 26 0 - 40 IU/L   ALT 30 0 - 44 IU/L  Lipid panel  Result Value Ref Range   Cholesterol, Total 196 100 - 199 mg/dL   Triglycerides 194 (H) 0 - 149 mg/dL   HDL 46 >39 mg/dL   VLDL Cholesterol Cal 39 5  - 40 mg/dL   LDL Calculated 111 (H) 0 - 99 mg/dL   Chol/HDL Ratio 4.3 0.0 - 5.0 ratio  PSA  Result Value Ref Range   Prostate Specific Ag, Serum 1.1 0.0 - 4.0 ng/mL  TSH  Result Value Ref Range   TSH 3.890 0.450 - 4.500 uIU/mL  Urinalysis, Routine w reflex microscopic  Result Value Ref Range   Specific Gravity, UA 1.010 1.005 - 1.030   pH, UA 7.0 5.0 - 7.5   Color, UA Yellow Yellow   Appearance Ur Clear Clear   Leukocytes, UA Trace (A) Negative   Protein, UA Negative Negative/Trace   Glucose, UA Negative Negative   Ketones, UA Negative Negative   RBC, UA Trace (A) Negative   Bilirubin, UA Negative Negative   Urobilinogen, Ur 0.2 0.2 - 1.0 mg/dL   Nitrite, UA Negative Negative   Microscopic Examination See below:       Assessment & Plan:   Problem List Items Addressed This Visit      Other   Shortness of breath    Unknown cause of shortness of breath most likely secondary to reflux acid burning patient larynx area. If Persists or becomes problematic will need to recheck. Discussed use of Prilosec      Relevant Orders   EKG 12-Lead (Completed)       Follow up plan: Return for Physical Exam.

## 2018-05-18 ENCOUNTER — Encounter: Payer: Self-pay | Admitting: Family Medicine

## 2018-06-07 ENCOUNTER — Encounter: Payer: BLUE CROSS/BLUE SHIELD | Admitting: Family Medicine

## 2018-06-08 ENCOUNTER — Encounter: Payer: Self-pay | Admitting: Family Medicine

## 2018-07-11 ENCOUNTER — Encounter: Payer: BLUE CROSS/BLUE SHIELD | Admitting: Family Medicine

## 2018-08-01 ENCOUNTER — Encounter: Payer: BLUE CROSS/BLUE SHIELD | Admitting: Family Medicine

## 2018-09-18 ENCOUNTER — Other Ambulatory Visit: Payer: Self-pay | Admitting: Family Medicine

## 2018-09-18 DIAGNOSIS — E039 Hypothyroidism, unspecified: Secondary | ICD-10-CM

## 2018-09-18 NOTE — Telephone Encounter (Signed)
Requested Prescriptions  Pending Prescriptions Disp Refills   levothyroxine (SYNTHROID, LEVOTHROID) 75 MCG tablet [Pharmacy Med Name: LEVOTHYROXINE 75 MCG TABLET] 90 tablet 4    Sig: TAKE 1 TABLET BY MOUTH EVERY DAY BEFORE BREAKFAST     Endocrinology:  Hypothyroid Agents Failed - 09/18/2018  8:22 AM      Failed - TSH needs to be rechecked within 3 months after an abnormal result. Refill until TSH is due.      Failed - TSH in normal range and within 360 days    TSH  Date Value Ref Range Status  05/29/2017 3.890 0.450 - 4.500 uIU/mL Final         Passed - Valid encounter within last 12 months    Recent Outpatient Visits          7 months ago Shortness of breath   [facility] Crissman, Jeannette How, MD   1 year ago Hypothyroidism, unspecified type   [facility] Crissman, Jeannette How, MD   1 year ago Chronic right-sided low back pain without sciatica   [facility], [place]   1 year ago Strain of lumbar region, initial encounter   [facility], Lilia Argue, [place]   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

## 2018-09-20 ENCOUNTER — Encounter: Payer: BLUE CROSS/BLUE SHIELD | Admitting: Family Medicine

## 2018-12-03 ENCOUNTER — Ambulatory Visit (INDEPENDENT_AMBULATORY_CARE_PROVIDER_SITE_OTHER): Payer: BLUE CROSS/BLUE SHIELD | Admitting: Family Medicine

## 2018-12-03 ENCOUNTER — Encounter: Payer: Self-pay | Admitting: Family Medicine

## 2018-12-03 VITALS — BP 121/81 | HR 80 | Temp 98.5°F | Ht 65.51 in | Wt 198.0 lb

## 2018-12-03 DIAGNOSIS — Z23 Encounter for immunization: Secondary | ICD-10-CM

## 2018-12-03 DIAGNOSIS — M25562 Pain in left knee: Secondary | ICD-10-CM | POA: Diagnosis not present

## 2018-12-03 DIAGNOSIS — G8929 Other chronic pain: Secondary | ICD-10-CM

## 2018-12-03 DIAGNOSIS — E039 Hypothyroidism, unspecified: Secondary | ICD-10-CM

## 2018-12-03 DIAGNOSIS — F1020 Alcohol dependence, uncomplicated: Secondary | ICD-10-CM | POA: Diagnosis not present

## 2018-12-03 DIAGNOSIS — Z1211 Encounter for screening for malignant neoplasm of colon: Secondary | ICD-10-CM

## 2018-12-03 DIAGNOSIS — Z0000 Encounter for general adult medical examination without abnormal findings: Secondary | ICD-10-CM | POA: Diagnosis not present

## 2018-12-03 DIAGNOSIS — IMO0001 Reserved for inherently not codable concepts without codable children: Secondary | ICD-10-CM

## 2018-12-03 LAB — MICROSCOPIC EXAMINATION: BACTERIA UA: NONE SEEN

## 2018-12-03 LAB — URINALYSIS, ROUTINE W REFLEX MICROSCOPIC
Bilirubin, UA: NEGATIVE
Glucose, UA: NEGATIVE
KETONES UA: NEGATIVE
Leukocytes, UA: NEGATIVE
Nitrite, UA: NEGATIVE
Protein, UA: NEGATIVE
Specific Gravity, UA: 1.02 (ref 1.005–1.030)
Urobilinogen, Ur: 0.2 mg/dL (ref 0.2–1.0)
pH, UA: 6 (ref 5.0–7.5)

## 2018-12-03 MED ORDER — LEVOTHYROXINE SODIUM 75 MCG PO TABS: 75.0000 ug | ORAL_TABLET | Freq: Every day | ORAL | 4 refills | Status: DC

## 2018-12-03 MED ORDER — DISULFIRAM 250 MG PO TABS: 250.0000 mg | ORAL_TABLET | Freq: Every day | ORAL | 4 refills | Status: DC

## 2018-12-03 NOTE — Patient Instructions (Signed)
Td Vaccine (Tetanus and Diphtheria): What You Need to Know 1. Why get vaccinated? Tetanus  and diphtheria are very serious diseases. They are rare in the [place] today, but people who do become infected often have severe complications. Td vaccine is used to protect adolescents and adults from both of these diseases. Both tetanus and diphtheria are infections caused by bacteria. Diphtheria spreads from person to person through coughing or sneezing. Tetanus-causing bacteria enter the body through cuts, scratches, or wounds. TETANUS (lockjaw) causes painful muscle tightening and stiffness, usually all over the body.  It can lead to tightening of muscles in the head and neck so you can't open your mouth, swallow, or sometimes even breathe. Tetanus kills about 1 out of every 10 people who are infected even after receiving the best medical care.  DIPHTHERIA can cause a thick coating to form in the back of the throat.  It can lead to breathing problems, paralysis, heart failure, and death.  Before vaccines, as many as 200,000 cases of diphtheria and hundreds of cases of tetanus were reported in the [place] each year. Since vaccination began, reports of cases for both diseases have dropped by about 99%. 2. Td vaccine Td vaccine can protect adolescents and adults from tetanus and diphtheria. Td is usually given as a booster dose every 10 years but it can also be given earlier after a severe and dirty wound or burn. Another vaccine, called Tdap, which protects against pertussis in addition to tetanus and diphtheria, is sometimes recommended instead of Td vaccine. Your doctor or the person giving you the vaccine can give you more information. Td may safely be given at the same time as other vaccines. 3. Some people should not get this vaccine  A person who has ever had a life-threatening allergic reaction after a previous dose of any tetanus or diphtheria containing vaccine, OR has a severe  allergy to any part of this vaccine, should not get Td vaccine. Tell the person giving the vaccine about any severe allergies.  Talk to your doctor if you: ? had severe pain or swelling after any vaccine containing diphtheria or tetanus, ? ever had a condition called Guillain Barre Syndrome (GBS), ? aren't feeling well on the day the shot is scheduled. 4. What are the risks from Td vaccine? With any medicine, including vaccines, there is a chance of side effects. These are usually mild and go away on their own. Serious reactions are also possible but are rare. Most people who get Td vaccine do not have any problems with it. Mild problems following Td vaccine: (Did not interfere with activities)  Pain where the shot was given (about 8 people in 10)  Redness or swelling where the shot was given (about 1 person in 4)  Mild fever (rare)  Headache (about 1 person in 4)  Tiredness (about 1 person in 4)  Moderate problems following Td vaccine: (Interfered with activities, but did not require medical attention)  Fever over 102F (rare)  Severe problems following Td vaccine: (Unable to perform usual activities; required medical attention)  Swelling, severe pain, bleeding and/or redness in the arm where the shot was given (rare).  Problems that could happen after any vaccine:  People sometimes faint after a medical procedure, including vaccination. Sitting or lying down for about 15 minutes can help prevent fainting, and injuries caused by a fall. Tell your doctor if you feel dizzy, or have vision changes or ringing in the ears.  Some people get   severe pain in the shoulder and have difficulty moving the arm where a shot was given. This happens very rarely.  Any medication can cause a severe allergic reaction. Such reactions from a vaccine are very rare, estimated at fewer than 1 in a million doses, and would happen within a few minutes to a few hours after the vaccination. As with any  medicine, there is a very remote chance of a vaccine causing a serious injury or death. The safety of vaccines is always being monitored. For more information, visit: www.cdc.gov/vaccinesafety/ 5. What if there is a serious reaction? What should I look for? Look for anything that concerns you, such as signs of a severe allergic reaction, very high fever, or unusual behavior. Signs of a severe allergic reaction can include hives, swelling of the face and throat, difficulty breathing, a fast heartbeat, dizziness, and weakness. These would usually start a few minutes to a few hours after the vaccination. What should I do?  If you think it is a severe allergic reaction or other emergency that can't wait, call 9-1-1 or get the person to the nearest hospital. Otherwise, call your doctor.  Afterward, the reaction should be reported to the Vaccine Adverse Event Reporting System (VAERS). Your doctor might file this report, or you can do it yourself through the VAERS web site at www.vaers.hhs.gov, or by calling 1-800-822-7967. ? VAERS does not give medical advice. 6. The National Vaccine Injury Compensation Program The National Vaccine Injury Compensation Program (VICP) is a federal program that was created to compensate people who may have been injured by certain vaccines. Persons who believe they may have been injured by a vaccine can learn about the program and about filing a claim by calling 1-800-338-2382 or visiting the VICP website at www.hrsa.gov/vaccinecompensation. There is a time limit to file a claim for compensation. 7. How can I learn more?  Ask your doctor. He or she can give you the vaccine package insert or suggest other sources of information.  Call your local or state health department.  Contact the Centers for Disease Control and Prevention (CDC): ? Call 1-800-232-4636 (1-800-CDC-INFO) ? Visit CDC's website at www.cdc.gov/vaccines CDC Td Vaccine VIS (03/29/16) This information is  not intended to replace advice given to you by your health care provider. Make sure you discuss any questions you have with your health care provider. Document Released: 10/02/2006 Document Revised: 08/25/2016 Document Reviewed: 08/25/2016 Elsevier Interactive Patient Education  2017 Elsevier Inc.  

## 2018-12-03 NOTE — Assessment & Plan Note (Signed)
discussed

## 2018-12-03 NOTE — Assessment & Plan Note (Signed)
The current medical regimen is effective;  continue present plan and medications.  
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
## 2018-12-04 ENCOUNTER — Encounter: Payer: Self-pay | Admitting: Family Medicine

## 2018-12-04 LAB — CBC WITH DIFFERENTIAL/PLATELET
Basophils Absolute: 0.1 10*3/uL (ref 0.0–0.2)
Basos: 1 %
EOS (ABSOLUTE): 0.3 10*3/uL (ref 0.0–0.4)
EOS: 3 %
HEMATOCRIT: 46.6 % (ref 37.5–51.0)
Hemoglobin: 16.2 g/dL (ref 13.0–17.7)
Immature Grans (Abs): 0 10*3/uL (ref 0.0–0.1)
Immature Granulocytes: 0 %
Lymphocytes Absolute: 3.7 10*3/uL — ABNORMAL HIGH (ref 0.7–3.1)
Lymphs: 36 %
MCH: 29.3 pg (ref 26.6–33.0)
MCHC: 34.8 g/dL (ref 31.5–35.7)
MCV: 84 fL (ref 79–97)
Monocytes Absolute: 1 10*3/uL — ABNORMAL HIGH (ref 0.1–0.9)
Monocytes: 10 %
Neutrophils Absolute: 5.3 10*3/uL (ref 1.4–7.0)
Neutrophils: 50 %
Platelets: 245 10*3/uL (ref 150–450)
RBC: 5.52 x10E6/uL (ref 4.14–5.80)
RDW: 13.3 % (ref 12.3–15.4)
WBC: 10.4 10*3/uL (ref 3.4–10.8)

## 2018-12-04 LAB — LIPID PANEL
Chol/HDL Ratio: 4.9 ratio (ref 0.0–5.0)
Cholesterol, Total: 250 mg/dL — ABNORMAL HIGH (ref 100–199)
HDL: 51 mg/dL (ref >39)
LDL Calculated: 153 mg/dL — ABNORMAL HIGH (ref 0–99)
Triglycerides: 229 mg/dL — ABNORMAL HIGH (ref 0–149)
VLDL Cholesterol Cal: 46 mg/dL — ABNORMAL HIGH (ref 5–40)

## 2018-12-04 LAB — COMPREHENSIVE METABOLIC PANEL
ALT: 51 IU/L — ABNORMAL HIGH (ref 0–44)
AST: 34 IU/L (ref 0–40)
Albumin/Globulin Ratio: 1.7 (ref 1.2–2.2)
Albumin: 4.7 g/dL (ref 3.5–5.5)
Alkaline Phosphatase: 88 IU/L (ref 39–117)
BILIRUBIN TOTAL: 0.8 mg/dL (ref 0.0–1.2)
BUN/Creatinine Ratio: 15 (ref 9–20)
BUN: 17 mg/dL (ref 6–24)
CHLORIDE: 99 mmol/L (ref 96–106)
CO2: 20 mmol/L (ref 20–29)
Calcium: 10.2 mg/dL (ref 8.7–10.2)
Creatinine, Ser: 1.12 mg/dL (ref 0.76–1.27)
GFR calc Af Amer: 87 mL/min/{1.73_m2} (ref >59)
GFR calc non Af Amer: 76 mL/min/{1.73_m2} (ref >59)
Globulin, Total: 2.8 g/dL (ref 1.5–4.5)
Glucose: 84 mg/dL (ref 65–99)
Potassium: 4.6 mmol/L (ref 3.5–5.2)
Sodium: 139 mmol/L (ref 134–144)
TOTAL PROTEIN: 7.5 g/dL (ref 6.0–8.5)

## 2018-12-04 LAB — TSH: TSH: 0.806 u[IU]/mL (ref 0.450–4.500)

## 2018-12-04 LAB — PSA: Prostate Specific Ag, Serum: 1.1 ng/mL (ref 0.0–4.0)

## 2018-12-07 ENCOUNTER — Telehealth: Payer: Self-pay

## 2018-12-07 NOTE — Telephone Encounter (Signed)
Returned call to patient to schedule colonoscopy.  LVM for him to call back.  Thanks [employer]

## 2018-12-07 NOTE — Telephone Encounter (Signed)
Pt is calling for colonoscopy

## 2018-12-14 ENCOUNTER — Telehealth: Payer: Self-pay | Admitting: Gastroenterology

## 2018-12-14 NOTE — Telephone Encounter (Signed)
Patient called today returning Earl Baker's call to schedule a colonoscopy. 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 Please call Monday at [phone] to schedule.

## 2018-12-21 NOTE — Telephone Encounter (Signed)
Returned patients call to schedule colonoscopy.  LVM for him to call back.  Thanks [employer]

## 2018-12-24 ENCOUNTER — Other Ambulatory Visit: Payer: Self-pay

## 2018-12-24 DIAGNOSIS — Z1211 Encounter for screening for malignant neoplasm of colon: Secondary | ICD-10-CM

## 2018-12-27 ENCOUNTER — Other Ambulatory Visit: Payer: Self-pay

## 2018-12-27 ENCOUNTER — Encounter: Payer: Self-pay | Admitting: *Deleted

## 2018-12-27 NOTE — Discharge Instructions (Signed)
General Anesthesia, Adult, Care After  This sheet gives you information about how to care for yourself after your procedure. Your health care provider may also give you more specific instructions. If you have problems or questions, contact your health care provider.  What can I expect after the procedure?  After the procedure, the following side effects are common:  Pain or discomfort at the IV site.  Nausea.  Vomiting.  Sore throat.  Trouble concentrating.  Feeling cold or chills.  Weak or tired.  Sleepiness and fatigue.  Soreness and body aches. These side effects can affect parts of the body that were not involved in surgery.  Follow these instructions at home:    For at least 24 hours after the procedure:  Have a responsible adult stay with you. It is important to have someone help care for you until you are awake and alert.  Rest as needed.  Do not:  Participate in activities in which you could fall or become injured.  Drive.  Use heavy machinery.  Drink alcohol.  Take sleeping pills or medicines that cause drowsiness.  Make important decisions or sign legal documents.  Take care of children on your own.  Eating and drinking  Follow any instructions from your health care provider about eating or drinking restrictions.  When you feel hungry, start by eating small amounts of foods that are soft and easy to digest (bland), such as toast. Gradually return to your regular diet.  Drink enough fluid to keep your urine pale yellow.  If you vomit, rehydrate by drinking water, juice, or clear broth.  General instructions  If you have sleep apnea, surgery and certain medicines can increase your risk for breathing problems. Follow instructions from your health care provider about wearing your sleep device:  Anytime you are sleeping, including during daytime naps.  While taking prescription pain medicines, sleeping medicines, or medicines that make you drowsy.  Return to your normal activities as told by your health care  provider. Ask your health care provider what activities are safe for you.  Take over-the-counter and prescription medicines only as told by your health care provider.  If you smoke, do not smoke without supervision.  Keep all follow-up visits as told by your health care provider. This is important.  Contact a health care provider if:  You have nausea or vomiting that does not get better with medicine.  You cannot eat or drink without vomiting.  You have pain that does not get better with medicine.  You are unable to pass urine.  You develop a skin rash.  You have a fever.  You have redness around your IV site that gets worse.  Get help right away if:  You have difficulty breathing.  You have chest pain.  You have blood in your urine or stool, or you vomit blood.  Summary  After the procedure, it is common to have a sore throat or nausea. It is also common to feel tired.  Have a responsible adult stay with you for the first 24 hours after general anesthesia. It is important to have someone help care for you until you are awake and alert.  When you feel hungry, start by eating small amounts of foods that are soft and easy to digest (bland), such as toast. Gradually return to your regular diet.  Drink enough fluid to keep your urine pale yellow.  Return to your normal activities as told by your health care provider. Ask your health care   provider what activities are safe for you.  This information is not intended to replace advice given to you by your health care provider. Make sure you discuss any questions you have with your health care provider.  Document Released: 03/13/2001 Document Revised: 07/21/2017 Document Reviewed: 07/21/2017  Elsevier Interactive Patient Education  2019 [employer] Inc[employer]

## 2018-12-28 DIAGNOSIS — M65862 Other synovitis and tenosynovitis, left lower leg: Secondary | ICD-10-CM | POA: Diagnosis not present

## 2019-01-03 ENCOUNTER — Ambulatory Visit: Payer: BLUE CROSS/BLUE SHIELD | Admitting: Anesthesiology

## 2019-01-03 ENCOUNTER — Ambulatory Visit
Admission: RE | Admit: 2019-01-03 | Discharge: 2019-01-03 | Disposition: A | Discharge status: Home / Self Care | Payer: BLUE CROSS/BLUE SHIELD | Attending: Gastroenterology | Admitting: Gastroenterology

## 2019-01-03 ENCOUNTER — Encounter: Payer: Self-pay | Admitting: Anesthesiology

## 2019-01-03 ENCOUNTER — Encounter
Admission: RE | Disposition: A | Discharge status: Home / Self Care | Payer: Self-pay | Source: Home / Self Care | Attending: Gastroenterology

## 2019-01-03 DIAGNOSIS — M1990 Unspecified osteoarthritis, unspecified site: Secondary | ICD-10-CM | POA: Diagnosis not present

## 2019-01-03 DIAGNOSIS — E039 Hypothyroidism, unspecified: Secondary | ICD-10-CM | POA: Diagnosis not present

## 2019-01-03 DIAGNOSIS — K640 First degree hemorrhoids: Secondary | ICD-10-CM | POA: Insufficient documentation

## 2019-01-03 DIAGNOSIS — B182 Chronic viral hepatitis C: Secondary | ICD-10-CM | POA: Insufficient documentation

## 2019-01-03 DIAGNOSIS — Z79899 Other long term (current) drug therapy: Secondary | ICD-10-CM | POA: Insufficient documentation

## 2019-01-03 DIAGNOSIS — K635 Polyp of colon: Secondary | ICD-10-CM | POA: Diagnosis not present

## 2019-01-03 DIAGNOSIS — Z7982 Long term (current) use of aspirin: Secondary | ICD-10-CM | POA: Diagnosis not present

## 2019-01-03 DIAGNOSIS — F17210 Nicotine dependence, cigarettes, uncomplicated: Secondary | ICD-10-CM | POA: Diagnosis not present

## 2019-01-03 DIAGNOSIS — K621 Rectal polyp: Secondary | ICD-10-CM

## 2019-01-03 DIAGNOSIS — Z7189 Other specified counseling: Secondary | ICD-10-CM

## 2019-01-03 DIAGNOSIS — E785 Hyperlipidemia, unspecified: Secondary | ICD-10-CM | POA: Diagnosis not present

## 2019-01-03 DIAGNOSIS — Z1211 Encounter for screening for malignant neoplasm of colon: Secondary | ICD-10-CM | POA: Diagnosis not present

## 2019-01-03 HISTORY — DX: Alcohol dependence, uncomplicated: F10.20

## 2019-01-03 HISTORY — PX: POLYPECTOMY: SHX5525

## 2019-01-03 HISTORY — DX: Hypothyroidism, unspecified: E03.9

## 2019-01-03 HISTORY — PX: COLONOSCOPY WITH PROPOFOL: SHX5780

## 2019-01-03 SURGERY — COLONOSCOPY WITH PROPOFOL
Anesthesia: General

## 2019-01-03 MED ORDER — PROPOFOL 10 MG/ML IV BOLUS
INTRAVENOUS | Status: DC | PRN
  Administered 2019-01-03: 150 mg via INTRAVENOUS
  Administered 2019-01-03 (×3): 50 mg via INTRAVENOUS

## 2019-01-03 MED ORDER — LIDOCAINE HCL (CARDIAC) PF 100 MG/5ML IV SOSY
PREFILLED_SYRINGE | INTRAVENOUS | Status: DC | PRN
  Administered 2019-01-03: 30 mg via INTRAVENOUS

## 2019-01-03 MED ORDER — LACTATED RINGERS IV SOLN
10.0000 mL/h | INTRAVENOUS | Status: DC
  Administered 2019-01-03: 10 mL/h via INTRAVENOUS

## 2019-01-03 MED ORDER — STERILE WATER FOR IRRIGATION IR SOLN
Status: DC | PRN
  Administered 2019-01-03: 11:00:00

## 2019-01-03 MED ORDER — ONDANSETRON HCL 4 MG/2ML IJ SOLN: 4.0000 mg | Freq: Once | INTRAMUSCULAR | Status: DC | PRN

## 2019-01-03 MED ORDER — SODIUM CHLORIDE 0.9 % IV SOLN: INTRAVENOUS | Status: DC

## 2019-01-03 SURGICAL SUPPLY — 24 items

## 2019-01-03 NOTE — Anesthesia Postprocedure Evaluation (Signed)
Anesthesia Post Note  Patient: Earl Baker  Procedure(s) Performed: COLONOSCOPY WITH PROPOFOL (N/A ) POLYPECTOMY  Patient location during evaluation: PACU Anesthesia Type: General Level of consciousness: awake Pain management: pain level controlled Vital Signs Assessment: post-procedure vital signs reviewed and stable Respiratory status: respiratory function stable Cardiovascular status: stable Postop Assessment: no signs of nausea or vomiting Anesthetic complications: no    Veda Canning

## 2019-01-03 NOTE — Op Note (Signed)
[facility] Gastroenterology Patient Name: Earl Baker Procedure Date: 01/03/2019 10:39 AM MRN: [id number] Account #: [id number] Date of Birth: 10-06-1966 Admit Type: Outpatient Age: 53 Room: [facility] OR ROOM 01 Gender: Male Note Status: Finalized Procedure:            Colonoscopy Indications:          Screening for colorectal malignant neoplasm Providers:            Lucilla Lame MD, MD Referring MD:         Guadalupe Maple, MD (Referring MD) Medicines:            Propofol per Anesthesia Complications:        No immediate complications. Procedure:            Pre-Anesthesia Assessment:                       - Prior to the procedure, a History and Physical was                        performed, and patient medications and allergies were                        reviewed. The patient's tolerance of previous                        anesthesia was also reviewed. The risks and benefits of                        the procedure and the sedation options and risks were                        discussed with the patient. All questions were                        answered, and informed consent was obtained. Prior                        Anticoagulants: The patient has taken no previous                        anticoagulant or antiplatelet agents. ASA Grade                        Assessment: II - A patient with mild systemic disease.                        After reviewing the risks and benefits, the patient was                        deemed in satisfactory condition to undergo the                        procedure.                       After obtaining informed consent, the colonoscope was                        passed under direct vision. Throughout the procedure,  the patient's blood pressure, pulse, and oxygen                        saturations were monitored continuously. The                        Colonoscope was introduced through the anus and              advanced to the the cecum, identified by appendiceal                        orifice and ileocecal valve. The colonoscopy was                        performed without difficulty. The patient tolerated the                        procedure well. The quality of the bowel preparation                        was excellent. Findings:      The perianal and digital rectal examinations were normal.      Four sessile polyps were found in the rectum. The polyps were 1 to 3 mm       in size. These polyps were removed with a cold biopsy forceps. Resection       and retrieval were complete.      Non-bleeding internal hemorrhoids were found during retroflexion. The       hemorrhoids were Grade I (internal hemorrhoids that do not prolapse). Impression:           - Four 1 to 3 mm polyps in the rectum, removed with a                        cold biopsy forceps. Resected and retrieved.                       - Non-bleeding internal hemorrhoids. Recommendation:       - Discharge patient to home.                       - Resume previous diet.                       - Continue present medications.                       - Await pathology results.                       - Repeat colonoscopy in 5 years if polyp adenoma and 10                        years if hyperplastic Procedure Code(s):    --- Professional ---                       [phone], Colonoscopy, flexible; with biopsy, single or                        multiple Diagnosis Code(s):    --- Professional ---  Z12.11, Encounter for screening for malignant neoplasm                        of colon                       K62.1, Rectal polyp CPT copyright 2018 American Medical Association. All rights reserved. The codes documented in this report are preliminary and upon coder review may  be revised to meet current compliance requirements. Lucilla Lame MD, MD 01/03/2019 11:00:08 AM This report has been signed electronically. Number of  Addenda: 0 Note Initiated On: 01/03/2019 10:39 AM Scope Withdrawal Time: 0 hours 8 minutes 13 seconds  Total Procedure Duration: 0 hours 57 minutes 46 seconds       [facility]

## 2019-01-03 NOTE — Anesthesia Preprocedure Evaluation (Signed)
Anesthesia Evaluation  Patient identified by MRN, date of birth, ID band Patient awake    Reviewed: Allergy & Precautions, NPO status , Patient's Chart, lab work & pertinent test results  Airway Mallampati: II  TM Distance: >3 FB     Dental   Pulmonary Current Smoker,    breath sounds clear to auscultation       Cardiovascular negative cardio ROS   Rhythm:Regular Rate:Normal  HLD   Neuro/Psych Depression Hx alcoholism and substance abuse - taking antabuse    GI/Hepatic (+) Hepatitis - (treated), C  Endo/Other  Hypothyroidism   Renal/GU      Musculoskeletal  (+) Arthritis ,   Abdominal   Peds  Hematology   Anesthesia Other Findings   Reproductive/Obstetrics                             Anesthesia Physical Anesthesia Plan  ASA: II  Anesthesia Plan: General   Post-op Pain Management:    Induction: Intravenous  PONV Risk Score and Plan:   Airway Management Planned: Nasal Cannula and Natural Airway  Additional Equipment:   Intra-op Plan:   Post-operative Plan:   Informed Consent: I have reviewed the patients History and Physical, chart, labs and discussed the procedure including the risks, benefits and alternatives for the proposed anesthesia with the patient or authorized representative who has indicated his/her understanding and acceptance.       Plan Discussed with: CRNA  Anesthesia Plan Comments:         Anesthesia Quick Evaluation

## 2019-01-03 NOTE — Transfer of Care (Signed)
Immediate Anesthesia Transfer of Care Note  Patient: Earl Baker  Procedure(s) Performed: COLONOSCOPY WITH PROPOFOL (N/A ) POLYPECTOMY  Patient Location: PACU  Anesthesia Type: General  Level of Consciousness: awake, alert  and patient cooperative  Airway and Oxygen Therapy: Patient Spontanous Breathing and Patient connected to supplemental oxygen  Post-op Assessment: Post-op Vital signs reviewed, Patient's Cardiovascular Status Stable, Respiratory Function Stable, Patent Airway and No signs of Nausea or vomiting  Post-op Vital Signs: Reviewed and stable  Complications: No apparent anesthesia complications

## 2019-01-03 NOTE — H&P (Signed)
Earl Lame, MD [address] [place], [place] [address] [phone] Fax : [phone]  Primary Care Physician:  Earl Maple, MD Primary Gastroenterologist:  Dr. Allen Baker  Pre-Procedure History & Physical: HPI:  Earl Baker is a 53 y.o. male is here for an colonoscopy.   Past Medical History:  Diagnosis Date  . Alcoholism /alcohol abuse ([place]) 09/22/2015  . Arthritis   . Hep C w/o coma, chronic (HCC)    Treated.  Pre 2010.  Marland Kitchen Hyperlipidemia   . Hypothyroidism   . Low back pain   . Substance abuse [facility])     Past Surgical History:  Procedure Laterality Date  . APPENDECTOMY      Prior to Admission medications   Medication Sig Start Date End Date Taking? Authorizing Provider  ASPIRIN 81 PO Take by mouth.   Yes [provider]  disulfiram (ANTABUSE) 250 MG tablet Take 1 tablet (250 mg total) by mouth daily. 12/03/18  Yes Crissman, Jeannette How, MD  levothyroxine (SYNTHROID, LEVOTHROID) 75 MCG tablet Take 1 tablet (75 mcg total) by mouth daily before breakfast. 12/03/18  Yes Crissman, Jeannette How, MD  Multiple Vitamins-Minerals (MULTIVITAMIN ADULT PO) Take by mouth.   Yes [provider]  Omega-3 Fatty Acids (FISH OIL PO) Take by mouth.   Yes [provider]    Allergies as of 12/24/2018  . (No Known Allergies)    Family History  Problem Relation Age of Onset  . Hyperlipidemia Father   . Cancer Father        prostate  . Alcohol abuse Brother     Social History   Socioeconomic History  . Marital status: Single    Spouse name: Not on file  . Number of children: Not on file  . Years of education: Not on file  . Highest education level: Not on file  Occupational History  . Not on file  Social Needs  . Financial resource strain: Not on file  . Food insecurity:    Worry: Not on file    Inability: Not on file  . Transportation needs:    Medical: Not on file    Non-medical: Not on file  Tobacco Use  . Smoking  status: Current Every Day Smoker    Packs/day: 0.25    Years: 30.00    Pack years: 7.50    Types: Cigarettes  . Smokeless tobacco: Never Used  Substance and Sexual Activity  . Alcohol use: Not Currently    Alcohol/week: 36.0 standard drinks    Types: 36 Cans of beer per week    Comment: weekends - Pt reports none in over 1 week  . Drug use: Yes    Types: Cocaine    Comment: pt reports none in over 1 month  . Sexual activity: Not on file  Lifestyle  . Physical activity:    Days per week: Not on file    Minutes per session: Not on file  . Stress: Not on file  Relationships  . Social connections:    Talks on phone: Not on file    Gets together: Not on file    Attends religious service: Not on file    Active member of club or organization: Not on file    Attends meetings of clubs or organizations: Not on file    Relationship status: Not on file  . Intimate partner violence:    Fear of current or ex partner: Not on file    Emotionally abused:  Not on file    Physically abused: Not on file    Forced sexual activity: Not on file  Other Topics Concern  . Not on file  Social History Narrative  . Not on file    Review of Systems: See HPI, otherwise negative ROS  Physical Exam: BP 118/87   Pulse 72   Temp 98.1 F (36.7 C) (Temporal)   Ht 5\' 6"  (1.676 m)   Wt 87.5 kg   SpO2 97%   BMI 31.15 kg/m  General:   Alert,  pleasant and cooperative in NAD Head:  Normocephalic and atraumatic. Neck:  Supple; no masses or thyromegaly. Lungs:  Clear throughout to auscultation.    Heart:  Regular rate and rhythm. Abdomen:  Soft, nontender and nondistended. Normal bowel sounds, without guarding, and without rebound.   Neurologic:  Alert and  oriented x4;  grossly normal neurologically.  Impression/Plan: Earl Baker is here for an colonoscopy to be performed for screening  Risks, benefits, limitations, and alternatives regarding  colonoscopy have been reviewed with the  patient.  Questions have been answered.  All parties agreeable.   Earl Lame, MD  01/03/2019, 9:49 AM

## 2019-01-04 ENCOUNTER — Encounter: Payer: Self-pay | Admitting: Gastroenterology

## 2019-01-05 ENCOUNTER — Encounter: Payer: Self-pay | Admitting: Gastroenterology

## 2019-06-10 ENCOUNTER — Other Ambulatory Visit: Payer: Self-pay

## 2019-06-10 ENCOUNTER — Ambulatory Visit (INDEPENDENT_AMBULATORY_CARE_PROVIDER_SITE_OTHER): Payer: BC Managed Care – PPO | Admitting: Family Medicine

## 2019-06-10 ENCOUNTER — Other Ambulatory Visit: Payer: BC Managed Care – PPO

## 2019-06-10 ENCOUNTER — Encounter: Payer: Self-pay | Admitting: Family Medicine

## 2019-06-10 DIAGNOSIS — F1020 Alcohol dependence, uncomplicated: Secondary | ICD-10-CM

## 2019-06-10 DIAGNOSIS — E785 Hyperlipidemia, unspecified: Secondary | ICD-10-CM | POA: Insufficient documentation

## 2019-06-10 DIAGNOSIS — IMO0001 Reserved for inherently not codable concepts without codable children: Secondary | ICD-10-CM

## 2019-06-10 DIAGNOSIS — E039 Hypothyroidism, unspecified: Secondary | ICD-10-CM | POA: Diagnosis not present

## 2019-06-10 LAB — LP+ALT+AST PICCOLO, WAIVED
ALT (SGPT) Piccolo, Waived: 34 U/L (ref 10–47)
AST (SGOT) Piccolo, Waived: 28 U/L (ref 11–38)
Chol/HDL Ratio Piccolo,Waive: 4.2 mg/dL
Cholesterol Piccolo, Waived: 180 mg/dL (ref <200)
HDL Chol Piccolo, Waived: 43 mg/dL — ABNORMAL LOW (ref >59)
LDL Chol Calc Piccolo Waived: 115 mg/dL — ABNORMAL HIGH (ref <100)
Triglycerides Piccolo,Waived: 107 mg/dL (ref <150)
VLDL Chol Calc Piccolo,Waive: 21 mg/dL (ref <30)

## 2019-06-10 NOTE — Progress Notes (Signed)
There were no vitals taken for this visit.   Subjective:    Patient ID: Earl Baker, male    DOB: 1966-09-05, 53 y.o.   MRN: [id number]  HPI: Earl Baker is a 53 y.o. male  Med check  Follow-up patient all in all doing well no complaints hardly drinking any alcohol at all taking thyroid medicines without problems. On chart review patient's last cholesterol was elevated patient's lost weight and is been doing better with diet and exercise will recheck cholesterol and lipids today. Taking thyroid without problems  Relevant past medical, surgical, family and social history reviewed and updated as indicated. Interim medical history since our last visit reviewed. Allergies and medications reviewed and updated.  Review of Systems  Constitutional: Negative.   Respiratory: Negative.   Cardiovascular: Negative.     Per HPI unless specifically indicated above     Objective:    There were no vitals taken for this visit.  Wt Readings from Last 3 Encounters:  01/03/19 193 lb (87.5 kg)  12/03/18 198 lb (89.8 kg)  02/06/18 191 lb (86.6 kg)    Physical Exam  Results for orders placed or performed in visit on 12/03/18  Microscopic Examination   URINE  Result Value Ref Range   WBC, UA 0-5 0 - 5 /hpf   RBC, UA 0-2 0 - 2 /hpf   Epithelial Cells (non renal) 0-10 0 - 10 /hpf   Bacteria, UA None seen None seen/Few  Comprehensive metabolic panel  Result Value Ref Range   Glucose 84 65 - 99 mg/dL   BUN 17 6 - 24 mg/dL   Creatinine, Ser 1.12 0.76 - 1.27 mg/dL   GFR calc non Af Amer 76 >59 mL/min/1.73   GFR calc Af Amer 87 >59 mL/min/1.73   BUN/Creatinine Ratio 15 9 - 20   Sodium 139 134 - 144 mmol/L   Potassium 4.6 3.5 - 5.2 mmol/L   Chloride 99 96 - 106 mmol/L   CO2 20 20 - 29 mmol/L   Calcium 10.2 8.7 - 10.2 mg/dL   Total Protein 7.5 6.0 - 8.5 g/dL   Albumin 4.7 3.5 - 5.5 g/dL   Globulin, Total 2.8 1.5 - 4.5 g/dL   Albumin/Globulin Ratio 1.7 1.2 - 2.2   Bilirubin Total 0.8 0.0 - 1.2 mg/dL   Alkaline Phosphatase 88 39 - 117 IU/L   AST 34 0 - 40 IU/L   ALT 51 (H) 0 - 44 IU/L  Lipid panel  Result Value Ref Range   Cholesterol, Total 250 (H) 100 - 199 mg/dL   Triglycerides 229 (H) 0 - 149 mg/dL   HDL 51 >39 mg/dL   VLDL Cholesterol Cal 46 (H) 5 - 40 mg/dL   LDL Calculated 153 (H) 0 - 99 mg/dL   Chol/HDL Ratio 4.9 0.0 - 5.0 ratio  CBC with Differential/Platelet  Result Value Ref Range   WBC 10.4 3.4 - 10.8 x10E3/uL   RBC 5.52 4.14 - 5.80 x10E6/uL   Hemoglobin 16.2 13.0 - 17.7 g/dL   Hematocrit 46.6 37.5 - 51.0 %   MCV 84 79 - 97 fL   MCH 29.3 26.6 - 33.0 pg   MCHC 34.8 31.5 - 35.7 g/dL   RDW 13.3 12.3 - 15.4 %   Platelets 245 150 - 450 x10E3/uL   Neutrophils 50 Not Estab. %   Lymphs 36 Not Estab. %   Monocytes 10 Not Estab. %   Eos 3 Not Estab. %   Basos 1  Not Estab. %   Neutrophils Absolute 5.3 1.4 - 7.0 x10E3/uL   Lymphocytes Absolute 3.7 (H) 0.7 - 3.1 x10E3/uL   Monocytes Absolute 1.0 (H) 0.1 - 0.9 x10E3/uL   EOS (ABSOLUTE) 0.3 0.0 - 0.4 x10E3/uL   Basophils Absolute 0.1 0.0 - 0.2 x10E3/uL   Immature Granulocytes 0 Not Estab. %   Immature Grans (Abs) 0.0 0.0 - 0.1 x10E3/uL  TSH  Result Value Ref Range   TSH 0.806 0.450 - 4.500 uIU/mL  PSA  Result Value Ref Range   Prostate Specific Ag, Serum 1.1 0.0 - 4.0 ng/mL  Urinalysis, Routine w reflex microscopic  Result Value Ref Range   Specific Gravity, UA 1.020 1.005 - 1.030   pH, UA 6.0 5.0 - 7.5   Color, UA Yellow Yellow   Appearance Ur Clear Clear   Leukocytes, UA Negative Negative   Protein, UA Negative Negative/Trace   Glucose, UA Negative Negative   Ketones, UA Negative Negative   RBC, UA Trace (A) Negative   Bilirubin, UA Negative Negative   Urobilinogen, Ur 0.2 0.2 - 1.0 mg/dL   Nitrite, UA Negative Negative   Microscopic Examination See below:       Assessment & Plan:   Problem List Items Addressed This Visit      Endocrine   Hypothyroidism    The  current medical regimen is effective;  continue present plan and medications.         Other   Alcoholism /alcohol abuse ([place])    Currently minimizing use 2 drinks in the last 6 months      Hyperlipidemia    Will recheck      Relevant Orders   LP+ALT+AST Piccolo, [place] for a synchronous communication visit. Today's visit due to COVID-19 isolation precautions I connected with and verified that I am speaking with the correct person using two identifiers.   I discussed the limitations, risks, security and privacy concerns of performing an evaluation and management service by telecommunication and the availability of in person appointments. I also discussed with the patient that there may be a patient responsible charge related to this service. The patient expressed understanding and agreed to proceed. [place] patient[place] location [place]. I am at home.   I discussed the assessment and treatment plan with the patient. The patient was provided an opportunity to ask questions and all were answered. The patient agreed with the plan and demonstrated an understanding of the instructions.   The patient was advised to call back or seek an in-person evaluation if the symptoms worsen or if the condition fails to improve as anticipated.   I provided 21+ minutes of time during this encounter.  Follow up plan: Return in about 6 months (around 12/10/2019) for Physical Exam.

## 2019-06-10 NOTE — Assessment & Plan Note (Signed)
Will recheck

## 2019-06-10 NOTE — Addendum Note (Signed)
Addended by: Crissie Figures R on: 06/10/2019 10:28 AM   Modules accepted: Orders

## 2019-06-10 NOTE — Assessment & Plan Note (Signed)
Currently minimizing use 2 drinks in the last 6 months

## 2019-06-10 NOTE — Assessment & Plan Note (Signed)
The current medical regimen is effective;  continue present plan and medications.  

## 2019-06-11 ENCOUNTER — Ambulatory Visit: Payer: BLUE CROSS/BLUE SHIELD | Admitting: Family Medicine

## 2019-07-04 ENCOUNTER — Encounter: Payer: Self-pay | Admitting: Family Medicine

## 2019-07-04 ENCOUNTER — Ambulatory Visit (INDEPENDENT_AMBULATORY_CARE_PROVIDER_SITE_OTHER): Payer: BC Managed Care – PPO | Admitting: Family Medicine

## 2019-07-04 ENCOUNTER — Other Ambulatory Visit: Payer: Self-pay

## 2019-07-04 DIAGNOSIS — R5383 Other fatigue: Secondary | ICD-10-CM

## 2019-07-04 DIAGNOSIS — E039 Hypothyroidism, unspecified: Secondary | ICD-10-CM

## 2019-07-04 NOTE — Assessment & Plan Note (Signed)
Discussed fatigue most likely secondary to sleep apnea will schedule sleep study.  Discussed cautions about driving etc.

## 2019-07-04 NOTE — Assessment & Plan Note (Signed)
The current medical regimen is effective;  continue present plan and medications.  

## 2019-07-04 NOTE — Progress Notes (Addendum)
There were no vitals taken for this visit.   Subjective:    Patient ID: Earl Baker, male    DOB: Jan 28, 1966, 53 y.o.   MRN: [id number]  HPI: Earl Baker is a 53 y.o. male  Concerns about sleep apnea Patient with concerns about sleep apnea. Has observed not breathing at night and severe snoring has had different people tell him he is got an issue with not breathing at night and severe snoring.  Patient has trouble with daytime drowsiness can sleep constantly and not wake up refreshed and still want to sleep more. Ready to have a sleep study done. Taking thyroid medication without problems chart review no issues with thyroid medications. Discussed drinking behavior and not drinking currently.   Relevant past medical, surgical, family and social history reviewed and updated as indicated. Interim medical history since our last visit reviewed. Allergies and medications reviewed and updated.  Review of Systems  Constitutional: Positive for fatigue.  Respiratory: Negative.   Cardiovascular: Negative.     Per HPI unless specifically indicated above     Objective:    There were no vitals taken for this visit.  Wt Readings from Last 3 Encounters:  01/03/19 193 lb (87.5 kg)  12/03/18 198 lb (89.8 kg)  02/06/18 191 lb (86.6 kg)    Physical Exam  Results for orders placed or performed in visit on 06/10/19  LP+ALT+AST Piccolo, Waived  Result Value Ref Range   ALT (SGPT) Piccolo, Waived 34 10 - 47 U/L   AST (SGOT) Piccolo, Waived 28 11 - 38 U/L   Cholesterol Piccolo, Waived 180 <200 mg/dL   HDL Chol Piccolo, Waived 43 (L) >59 mg/dL   Triglycerides Piccolo,Waived 107 <150 mg/dL   Chol/HDL Ratio Piccolo,Waive 4.2 mg/dL   LDL Chol Calc Piccolo Waived 115 (H) <100 mg/dL   VLDL Chol Calc Piccolo,Waive 21 <30 mg/dL      Assessment & Plan:   Problem List Items Addressed This Visit      Endocrine   Hypothyroidism    The current medical regimen is effective;   continue present plan and medications.         Other   Fatigue    Discussed fatigue most likely secondary to sleep apnea will schedule sleep study.  Discussed cautions about driving etc.      Relevant Orders   Ambulatory referral to Sleep Studies      Telemedicine using audio/video telecommunications for a synchronous communication visit. Today's visit due to COVID-19 isolation precautions I connected with and verified that I am speaking with the correct person using two identifiers.   I discussed the limitations, risks, security and privacy concerns of performing an evaluation and management service by telecommunication and the availability of in person appointments. I also discussed with the patient that there may be a patient responsible charge related to this service. The patient expressed understanding and agreed to proceed. The patient's location is home. I am at home.   I discussed the assessment and treatment plan with the patient. The patient was provided an opportunity to ask questions and all were answered. The patient agreed with the plan and demonstrated an understanding of the instructions.   The patient was advised to call back or seek an in-person evaluation if the symptoms worsen or if the condition fails to improve as anticipated.   I provided 21+ minutes of time during this encounter.  Follow up plan: Return if symptoms worsen or fail to improve, for As  scheduled.

## 2019-07-08 DIAGNOSIS — G4733 Obstructive sleep apnea (adult) (pediatric): Secondary | ICD-10-CM | POA: Diagnosis not present

## 2019-07-10 ENCOUNTER — Encounter: Payer: Self-pay | Admitting: Family Medicine

## 2019-07-12 DIAGNOSIS — G4733 Obstructive sleep apnea (adult) (pediatric): Secondary | ICD-10-CM | POA: Diagnosis not present

## 2019-07-13 ENCOUNTER — Encounter: Payer: Self-pay | Admitting: Nurse Practitioner

## 2019-07-23 DIAGNOSIS — G4733 Obstructive sleep apnea (adult) (pediatric): Secondary | ICD-10-CM | POA: Diagnosis not present

## 2019-07-24 ENCOUNTER — Other Ambulatory Visit: Payer: Self-pay

## 2019-07-24 ENCOUNTER — Encounter: Payer: Self-pay | Admitting: Nurse Practitioner

## 2019-07-24 ENCOUNTER — Ambulatory Visit (INDEPENDENT_AMBULATORY_CARE_PROVIDER_SITE_OTHER): Payer: BC Managed Care – PPO | Admitting: Nurse Practitioner

## 2019-07-24 VITALS — BP 117/81 | HR 61 | Temp 97.6°F

## 2019-07-24 DIAGNOSIS — M7020 Olecranon bursitis, unspecified elbow: Secondary | ICD-10-CM

## 2019-07-24 DIAGNOSIS — M7022 Olecranon bursitis, left elbow: Secondary | ICD-10-CM

## 2019-07-24 DIAGNOSIS — M542 Cervicalgia: Secondary | ICD-10-CM | POA: Insufficient documentation

## 2019-07-24 HISTORY — DX: Olecranon bursitis, unspecified elbow: M70.20

## 2019-07-24 MED ORDER — TRIAMCINOLONE ACETONIDE 0.1 % EX CREA: 1.0000 "application " | TOPICAL_CREAM | Freq: Two times a day (BID) | CUTANEOUS | 0 refills | Status: DC

## 2019-07-24 MED ORDER — CEPHALEXIN 500 MG PO CAPS: 500.0000 mg | ORAL_CAPSULE | Freq: Four times a day (QID) | ORAL | 0 refills | Status: AC

## 2019-07-24 NOTE — Assessment & Plan Note (Signed)
Over past months.  Referral to chiropractor placed.  Recommend using Biofreeze or Voltaren gel as needed + may use Tylenol minimally.  Gentle stretches daily.  Apply ice or heat as needed.  Return for worsening or continued issues and may consider imaging if ongoing.

## 2019-07-24 NOTE — Assessment & Plan Note (Signed)
Acute with reported improvement in edema.  Script for Keflex sent, also sent script for steroid cream for dry skin to elbows may use as needed.  Have recommend resting area, not placing on arms of chairs, support to area as needed.  May take Tylenol as needed, minimally, and apply ice.  Return for worsening or continued issues.  If no improvement consider referral to ortho for draining of area.

## 2019-07-24 NOTE — Patient Instructions (Signed)
Elbow Bursitis Rehab Ask your health care provider which exercises are safe for you. Do exercises exactly as told by your health care provider and adjust them as directed. It is normal to feel mild stretching, pulling, tightness, or discomfort as you do these exercises. Stop right away if you feel sudden pain or your pain gets worse. Do not begin these exercises until told by your health care provider. Stretching and range-of-motion exercises These exercises warm up your muscles and joints and improve the movement and flexibility of your elbow. The exercises also help to relieve pain and swelling. Elbow flexion, assisted 1. Stand or sit with your left / right arm at your side. 2. Use your other hand to gently push your left / right hand toward your shoulder (assisted) while bending your elbow (flexion). 3. Hold this position for __________ seconds. 4. Slowly return your left / right arm to the starting position. Repeat __________ times. Complete this exercise __________ times a day. Elbow extension, assisted 1. Lie on your back in a comfortable position that allows you to relax your arm muscles. 2. Place a folded towel under your left / right upper arm so that your elbow and shoulder are at the same height. 3. Use your other arm to raise your left / right arm (assisted) until your elbow and hand do not rest on the bed or towel. Hold your left / right arm out straight with your other hand supporting it. 4. Let the weight of your hand straighten your elbow (extension). You should feel a stretch on the inside of your elbow. ? Keep your arm and chest muscles relaxed. ? If directed, add a small wrist weight or hand weight to increase the stretch. 5. Hold this position for __________ seconds. 6. Slowly release the stretch and return to the starting position. Repeat __________ times. Complete this exercise __________ times a day. Elbow flexion, active 1. Stand or sit with your left / right elbow bent  and your palm facing in, toward your body. 2. Bend your elbow as far as you can using only your arm muscles (active flexion). 3. Hold this position for __________ seconds. 4. Slowly return to the starting position. Repeat __________ times. Complete this exercise __________ times a day. Elbow extension, active 1. Stand or sit with your left / right elbow bent and your palm facing in, toward your body. 2. Slowly straighten your elbow using only your arm muscles (active extension). Stop when you feel a gentle stretch at the front of your arm, or when your arm is straight. 3. Hold this position for __________ seconds. 4. Slowly return to the starting position. Repeat __________ times. Complete this exercise __________ times a day. Strengthening exercises These exercises build strength and endurance in your elbow. Endurance is the ability to use your muscles for a long time, even after they get tired. Elbow flexion, isometric 1. Stand or sit with your left / right arm at waist height. Your palm should face in, toward your body. 2. Place your other hand on top of your left / right forearm. Gently push down while you resist with your left / right arm (isometric flexion). ? Use about 50% effort with both arms. You may be instructed to use more and more effort with your arms each week. ? Try not to let your left / right arm move during the exercise. 3. Hold this position for __________ seconds. 4. Let your muscles relax completely before you repeat the exercise. Repeat __________ times.  Complete this exercise __________ times a day. Elbow extension, isometric  1. Stand or sit with your left / right arm at waist height. Your palm should face in, toward your body. 2. Place your other hand on the bottom of your left / right forearm. Gently push up while you resist with your left / right arm (isometric extension). ? Use about 50% effort with both arms. You may be instructed to use more and more effort  with your arms each week. ? Try not to let your left / right arm move during the exercise. 3. Hold this position for __________ seconds. 4. Let your muscles relax completely before you repeat the exercise. Repeat __________ times. Complete this exercise __________ times a day. Biceps curls 1. Sit on a stable chair without armrests, or stand up. 2. Hold a _________ weight in your left / right hand. Your palm should face out, away from your body, at the starting position. 3. Bend your left / right elbow and move your hand up toward your shoulder. Keep your elbow at your side while you bend it. 4. Slowly return to the starting position. Repeat __________ times. Complete this exercise __________ times a day. Triceps curls  1. Lie on your back. 2. Hold a _________ weight in your left / right hand. 3. Bend your left / right elbow to a 90-degree angle (right angle), so the weight is in front of your face, over your chest, and your elbow is pointed up to the ceiling. 4. Straighten your elbow, raising your hand toward the ceiling. Use your other hand to support your left / right upper arm and to keep it still. 5. Slowly return to the starting position. Repeat __________ times. Complete this exercise __________ times a day. This information is not intended to replace advice given to you by your health care provider. Make sure you discuss any questions you have with your health care provider. Document Released: 12/05/2005 Document Revised: 03/28/2019 Document Reviewed: 12/26/2018 Elsevier Patient Education  2020 [employer].

## 2019-07-24 NOTE — Progress Notes (Signed)
BP 117/81   Pulse 61   Temp 97.6 F (36.4 C) (Oral)   SpO2 98%    Subjective:    Patient ID: Earl Baker, male    DOB: [birth year], 53 y.o.   MRN: [id number]  HPI: Earl Baker is a 53 y.o. male  Chief Complaint  Patient presents with  . Elbow Pain    L elbow, red, painful and swollen for a week   ELBOW PAIN Started one week ago and started to be painful.  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  He reports the swelling has improved and he can move it better, initially could not straighten it well. Duration: weeks Location: elbow Mechanism of injury: unknown Onset: gradual Severity: 5/10  Quality:  dull and aching Frequency: intermittent Radiation: no Aggravating factors: bending  Alleviating factors: nothing  Status: fluctuating Treatments attempted: none  Relief with NSAIDs?:  No NSAIDs Taken Swelling: yes Redness: yes  Warmth: yes Trauma: no Chest pain: no  Shortness of breath: no  Fever: no Decreased sensation: no Paresthesias: no Weakness: no   NECK PAIN  Has had for two to three months to right side.  Would like to go to chiropractor. Status: stable Treatments attempted: none  Relief with NSAIDs?:  No NSAIDs Taken Location:R>L Duration:chronic Severity: mild Quality: dull and aching Frequency: intermittent Radiation: R arm Aggravating factors: movement Alleviating factors: nothing Weakness:  no Paresthesias / decreased sensation:  no  Fevers:  no  Relevant past medical, surgical, family and social history reviewed and updated as indicated. Interim medical history since our last visit reviewed. Allergies and medications reviewed and updated.  Review of Systems  Constitutional: Negative for activity change, diaphoresis, fatigue and fever.  Respiratory: Negative for cough, chest tightness, shortness of breath and wheezing.   Cardiovascular: Negative for chest pain,  palpitations and leg swelling.  Gastrointestinal: Negative for abdominal distention, abdominal pain, constipation, diarrhea, nausea and vomiting.  Musculoskeletal: Positive for arthralgias and neck pain.  Neurological: Negative.   Psychiatric/Behavioral: Negative.     Per HPI unless specifically indicated above     Objective:    BP 117/81   Pulse 61   Temp 97.6 F (36.4 C) (Oral)   SpO2 98%   Wt Readings from Last 3 Encounters:  01/03/19 193 lb (87.5 kg)  12/03/18 198 lb (89.8 kg)  02/06/18 191 lb (86.6 kg)    Physical Exam Vitals signs and nursing note reviewed.  Constitutional:      General: He is awake. He is not in acute distress.    Appearance: He is well-developed. He is not ill-appearing.  HENT:     Head: Normocephalic and atraumatic.     Right Ear: Hearing normal. No drainage.     Left Ear: Hearing normal. No drainage.     Mouth/Throat:     Pharynx: Uvula midline.  Eyes:     General: Lids are normal.        Right eye: No discharge.        Left eye: No discharge.     Conjunctiva/sclera: Conjunctivae normal.     Pupils: Pupils are equal, round, and reactive to light.  Neck:     Musculoskeletal: Normal range of motion and neck supple.     Thyroid: No thyromegaly.     Vascular: No carotid bruit.     Trachea: Trachea normal.  Cardiovascular:     Rate and Rhythm: Normal rate and regular  rhythm.     Heart sounds: Normal heart sounds, S1 normal and S2 normal. No murmur. No gallop.   Pulmonary:     Effort: Pulmonary effort is normal. No accessory muscle usage or respiratory distress.     Breath sounds: Normal breath sounds.  Abdominal:     General: Bowel sounds are normal.     Palpations: Abdomen is soft.  Musculoskeletal: Normal range of motion.     Right elbow: He exhibits normal range of motion, no swelling and no laceration. No tenderness found.     Left elbow: He exhibits swelling. He exhibits normal range of motion. Tenderness found. Olecranon process  tenderness noted.     Cervical back: He exhibits pain (mild discomfort reported with rotation right). He exhibits normal range of motion, no tenderness, no swelling, no edema and no spasm.     Right lower leg: No edema.     Left lower leg: No edema.     Comments: Left elbow with noted mild edema over olecranon process + erythema and warmth.  Mild tenderness.  Full ROM of elbow without difficulty or report pain.  Small abrasion noted to middle aspect of edema, abrasion with crusting.  Skin:    General: Skin is warm and dry.  Neurological:     Mental Status: He is alert and oriented to person, place, and time.     Deep Tendon Reflexes: Reflexes are normal and symmetric.  Psychiatric:        Attention and Perception: Attention normal.        Mood and Affect: Mood normal.        Speech: Speech normal.        Behavior: Behavior normal. Behavior is cooperative.        Thought Content: Thought content normal.        Judgment: Judgment normal.     Results for orders placed or performed in visit on 06/10/19  LP+ALT+AST Piccolo, Waived  Result Value Ref Range   ALT (SGPT) Piccolo, Waived 34 10 - 47 U/L   AST (SGOT) Piccolo, Waived 28 11 - 38 U/L   Cholesterol Piccolo, Waived 180 <200 mg/dL   HDL Chol Piccolo, Waived 43 (L) >59 mg/dL   Triglycerides Piccolo,Waived 107 <150 mg/dL   Chol/HDL Ratio Piccolo,Waive 4.2 mg/dL   LDL Chol Calc Piccolo Waived 115 (H) <100 mg/dL   VLDL Chol Calc Piccolo,Waive 21 <30 mg/dL      Assessment & Plan:   Problem List Items Addressed This Visit      Musculoskeletal and Integument   Olecranon bursitis - Primary    Acute with reported improvement in edema.  Script for Keflex sent, also sent script for steroid cream for dry skin to elbows may use as needed.  Have recommend resting area, not placing on arms of chairs, support to area as needed.  May take Tylenol as needed, minimally, and apply ice.  Return for worsening or continued issues.  If no improvement  consider referral to ortho for draining of area.        Other   Neck pain    Over past months.  Referral to chiropractor placed.  Recommend using Biofreeze or Voltaren gel as needed + may use Tylenol minimally.  Gentle stretches daily.  Apply ice or heat as needed.  Return for worsening or continued issues and may consider imaging if ongoing.      Relevant Orders   Ambulatory referral to Chiropractic       Follow  up plan: Return if symptoms worsen or fail to improve.

## 2019-08-02 ENCOUNTER — Encounter: Payer: Self-pay | Admitting: Nurse Practitioner

## 2019-08-02 ENCOUNTER — Ambulatory Visit (INDEPENDENT_AMBULATORY_CARE_PROVIDER_SITE_OTHER): Payer: BC Managed Care – PPO | Admitting: Nurse Practitioner

## 2019-08-02 ENCOUNTER — Other Ambulatory Visit: Payer: Self-pay

## 2019-08-02 VITALS — BP 109/71 | HR 65 | Temp 97.6°F | Ht 66.0 in | Wt 179.0 lb

## 2019-08-02 DIAGNOSIS — M7022 Olecranon bursitis, left elbow: Secondary | ICD-10-CM

## 2019-08-02 MED ORDER — CEPHALEXIN 500 MG PO CAPS: 500.0000 mg | ORAL_CAPSULE | Freq: Four times a day (QID) | ORAL | 0 refills | Status: AC

## 2019-08-02 NOTE — Progress Notes (Signed)
BP 109/71   Pulse 65   Temp 97.6 F (36.4 C) (Oral)   Ht 5\' 6"  (1.676 m)   Wt 179 lb (81.2 kg)   SpO2 98%   BMI 28.89 kg/m    Subjective:    Patient ID: Earl Baker, male    DOB: 05-02-1966, 53 y.o.   MRN: [id number]  HPI: Earl Baker is a 53 y.o. male  Chief Complaint  Patient presents with  . Elbow Pain    f/u   ELBOW PAIN Started two weeks ago and started to be painful, was seen 07/24/19 and treated for olecranon bursitis with Keflex and prescribed steroid cream to assist with dry elbows.  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  He reports today it is feeling better, is slightly smaller but still present and occasionally sore.  Endorses he did play golf after visiting with provider last week and that irritated it.  Has not been applying ice. Duration: weeks Location: elbow Mechanism of injury: unknown Onset: gradual Severity: 3/10  Quality:  dull and aching Frequency: intermittent Radiation: no Aggravating factors: bending  Alleviating factors: nothing  Status: fluctuating Treatments attempted: none  Relief with NSAIDs?:  No NSAIDs Taken Swelling: yes Redness: yes  Warmth: yes Trauma: no Chest pain: no  Shortness of breath: no  Fever: no Decreased sensation: no Paresthesias: no Weakness: no   Relevant past medical, surgical, family and social history reviewed and updated as indicated. Interim medical history since our last visit reviewed. Allergies and medications reviewed and updated.  Review of Systems  Constitutional: Negative for activity change, diaphoresis, fatigue and fever.  Respiratory: Negative for cough, chest tightness, shortness of breath and wheezing.   Cardiovascular: Negative for chest pain, palpitations and leg swelling.  Gastrointestinal: Negative for abdominal distention, abdominal pain, constipation, diarrhea, nausea and vomiting.  Musculoskeletal: Positive for joint swelling.   Neurological: Negative.   Psychiatric/Behavioral: Negative.     Per HPI unless specifically indicated above     Objective:    BP 109/71   Pulse 65   Temp 97.6 F (36.4 C) (Oral)   Ht 5\' 6"  (1.676 m)   Wt 179 lb (81.2 kg)   SpO2 98%   BMI 28.89 kg/m   Wt Readings from Last 3 Encounters:  08/02/19 179 lb (81.2 kg)  01/03/19 193 lb (87.5 kg)  12/03/18 198 lb (89.8 kg)    Physical Exam Vitals signs and nursing note reviewed.  Constitutional:      General: He is awake. He is not in acute distress.    Appearance: He is well-developed. He is not ill-appearing.  HENT:     Head: Normocephalic and atraumatic.     Right Ear: Hearing normal. No drainage.     Left Ear: Hearing normal. No drainage.     Mouth/Throat:     Pharynx: Uvula midline.  Eyes:     General: Lids are normal.        Right eye: No discharge.        Left eye: No discharge.     Conjunctiva/sclera: Conjunctivae normal.     Pupils: Pupils are equal, round, and reactive to light.  Neck:     Musculoskeletal: Normal range of motion and neck supple.     Thyroid: No thyromegaly.     Vascular: No carotid bruit.     Trachea: Trachea normal.  Cardiovascular:     Rate and Rhythm: Normal rate and regular rhythm.  Heart sounds: Normal heart sounds, S1 normal and S2 normal. No murmur. No gallop.   Pulmonary:     Effort: Pulmonary effort is normal. No accessory muscle usage or respiratory distress.     Breath sounds: Normal breath sounds.  Abdominal:     General: Bowel sounds are normal.     Palpations: Abdomen is soft.  Musculoskeletal:     Right elbow: He exhibits normal range of motion, no swelling and no laceration. No tenderness found.     Left elbow: He exhibits swelling. He exhibits normal range of motion. Tenderness found. Olecranon process tenderness noted.     Right lower leg: No edema.     Left lower leg: No edema.     Comments: Left elbow with mild edema over olecranon process + erythema and warmth  (area much smaller than previous exam and slightly less erythema).  No tenderness.  Full ROM of elbow without difficulty or report pain.  Abrasion previously noted healed.    Skin:    General: Skin is warm and dry.  Neurological:     Mental Status: He is alert and oriented to person, place, and time.     Deep Tendon Reflexes: Reflexes are normal and symmetric.  Psychiatric:        Attention and Perception: Attention normal.        Mood and Affect: Mood normal.        Speech: Speech normal.        Behavior: Behavior normal. Behavior is cooperative.        Thought Content: Thought content normal.        Judgment: Judgment normal.     Results for orders placed or performed in visit on 06/10/19  LP+ALT+AST Piccolo, Waived  Result Value Ref Range   ALT (SGPT) Piccolo, Waived 34 10 - 47 U/L   AST (SGOT) Piccolo, Waived 28 11 - 38 U/L   Cholesterol Piccolo, Waived 180 <200 mg/dL   HDL Chol Piccolo, Waived 43 (L) >59 mg/dL   Triglycerides Piccolo,Waived 107 <150 mg/dL   Chol/HDL Ratio Piccolo,Waive 4.2 mg/dL   LDL Chol Calc Piccolo Waived 115 (H) <100 mg/dL   VLDL Chol Calc Piccolo,Waive 21 <30 mg/dL      Assessment & Plan:   Problem List Items Addressed This Visit      Musculoskeletal and Integument   Olecranon bursitis - Primary    Acute and improving.  Will extend Keflex x 5 days and obtain labs today.  Have recommend resting area, not placing on arms of chairs, support to area as needed.  May take Tylenol as needed, minimally, and apply ice.  Return in 6 days.  If no improvement consider referral to ortho for draining of area.      Relevant Orders   CBC with Differential/Platelet   Uric acid       Follow up plan: Return in about 6 days (around 08/08/2019) for elbow follow-up.

## 2019-08-02 NOTE — Patient Instructions (Signed)
Elbow Bursitis Rehab Ask your health care provider which exercises are safe for you. Do exercises exactly as told by your health care provider and adjust them as directed. It is normal to feel mild stretching, pulling, tightness, or discomfort as you do these exercises. Stop right away if you feel sudden pain or your pain gets worse. Do not begin these exercises until told by your health care provider. Stretching and range-of-motion exercises These exercises warm up your muscles and joints and improve the movement and flexibility of your elbow. The exercises also help to relieve pain and swelling. Elbow flexion, assisted 1. Stand or sit with your left / right arm at your side. 2. Use your other hand to gently push your left / right hand toward your shoulder (assisted) while bending your elbow (flexion). 3. Hold this position for __________ seconds. 4. Slowly return your left / right arm to the starting position. Repeat __________ times. Complete this exercise __________ times a day. Elbow extension, assisted 1. Lie on your back in a comfortable position that allows you to relax your arm muscles. 2. Place a folded towel under your left / right upper arm so that your elbow and shoulder are at the same height. 3. Use your other arm to raise your left / right arm (assisted) until your elbow and hand do not rest on the bed or towel. Hold your left / right arm out straight with your other hand supporting it. 4. Let the weight of your hand straighten your elbow (extension). You should feel a stretch on the inside of your elbow. ? Keep your arm and chest muscles relaxed. ? If directed, add a small wrist weight or hand weight to increase the stretch. 5. Hold this position for __________ seconds. 6. Slowly release the stretch and return to the starting position. Repeat __________ times. Complete this exercise __________ times a day. Elbow flexion, active 1. Stand or sit with your left / right elbow bent  and your palm facing in, toward your body. 2. Bend your elbow as far as you can using only your arm muscles (active flexion). 3. Hold this position for __________ seconds. 4. Slowly return to the starting position. Repeat __________ times. Complete this exercise __________ times a day. Elbow extension, active 1. Stand or sit with your left / right elbow bent and your palm facing in, toward your body. 2. Slowly straighten your elbow using only your arm muscles (active extension). Stop when you feel a gentle stretch at the front of your arm, or when your arm is straight. 3. Hold this position for __________ seconds. 4. Slowly return to the starting position. Repeat __________ times. Complete this exercise __________ times a day. Strengthening exercises These exercises build strength and endurance in your elbow. Endurance is the ability to use your muscles for a long time, even after they get tired. Elbow flexion, isometric 1. Stand or sit with your left / right arm at waist height. Your palm should face in, toward your body. 2. Place your other hand on top of your left / right forearm. Gently push down while you resist with your left / right arm (isometric flexion). ? Use about 50% effort with both arms. You may be instructed to use more and more effort with your arms each week. ? Try not to let your left / right arm move during the exercise. 3. Hold this position for __________ seconds. 4. Let your muscles relax completely before you repeat the exercise. Repeat __________ times.  Complete this exercise __________ times a day. Elbow extension, isometric  1. Stand or sit with your left / right arm at waist height. Your palm should face in, toward your body. 2. Place your other hand on the bottom of your left / right forearm. Gently push up while you resist with your left / right arm (isometric extension). ? Use about 50% effort with both arms. You may be instructed to use more and more effort  with your arms each week. ? Try not to let your left / right arm move during the exercise. 3. Hold this position for __________ seconds. 4. Let your muscles relax completely before you repeat the exercise. Repeat __________ times. Complete this exercise __________ times a day. Biceps curls 1. Sit on a stable chair without armrests, or stand up. 2. Hold a _________ weight in your left / right hand. Your palm should face out, away from your body, at the starting position. 3. Bend your left / right elbow and move your hand up toward your shoulder. Keep your elbow at your side while you bend it. 4. Slowly return to the starting position. Repeat __________ times. Complete this exercise __________ times a day. Triceps curls  1. Lie on your back. 2. Hold a _________ weight in your left / right hand. 3. Bend your left / right elbow to a 90-degree angle (right angle), so the weight is in front of your face, over your chest, and your elbow is pointed up to the ceiling. 4. Straighten your elbow, raising your hand toward the ceiling. Use your other hand to support your left / right upper arm and to keep it still. 5. Slowly return to the starting position. Repeat __________ times. Complete this exercise __________ times a day. This information is not intended to replace advice given to you by your health care provider. Make sure you discuss any questions you have with your health care provider. Document Released: 12/05/2005 Document Revised: 03/28/2019 Document Reviewed: 12/26/2018 Elsevier Patient Education  2020 [employer].

## 2019-08-02 NOTE — Assessment & Plan Note (Signed)
Acute and improving.  Will extend Keflex x 5 days and obtain labs today.  Have recommend resting area, not placing on arms of chairs, support to area as needed.  May take Tylenol as needed, minimally, and apply ice.  Return in 6 days.  If no improvement consider referral to ortho for draining of area.

## 2019-08-03 LAB — CBC WITH DIFFERENTIAL/PLATELET
Basophils Absolute: 0.1 10*3/uL (ref 0.0–0.2)
Basos: 1 %
EOS (ABSOLUTE): 0.3 10*3/uL (ref 0.0–0.4)
Eos: 3 %
Hematocrit: 46.4 % (ref 37.5–51.0)
Hemoglobin: 15.4 g/dL (ref 13.0–17.7)
Immature Grans (Abs): 0 10*3/uL (ref 0.0–0.1)
Immature Granulocytes: 0 %
Lymphocytes Absolute: 3.2 10*3/uL — ABNORMAL HIGH (ref 0.7–3.1)
Lymphs: 40 %
MCH: 28.5 pg (ref 26.6–33.0)
MCHC: 33.2 g/dL (ref 31.5–35.7)
MCV: 86 fL (ref 79–97)
Monocytes Absolute: 0.6 10*3/uL (ref 0.1–0.9)
Monocytes: 8 %
Neutrophils Absolute: 3.7 10*3/uL (ref 1.4–7.0)
Neutrophils: 48 %
Platelets: 277 10*3/uL (ref 150–450)
RBC: 5.4 x10E6/uL (ref 4.14–5.80)
RDW: 13 % (ref 11.6–15.4)
WBC: 7.9 10*3/uL (ref 3.4–10.8)

## 2019-08-03 LAB — URIC ACID: Uric Acid: 6.1 mg/dL (ref 3.7–8.6)

## 2019-08-08 ENCOUNTER — Encounter: Payer: Self-pay | Admitting: Nurse Practitioner

## 2019-08-08 ENCOUNTER — Ambulatory Visit (INDEPENDENT_AMBULATORY_CARE_PROVIDER_SITE_OTHER): Payer: BC Managed Care – PPO | Admitting: Nurse Practitioner

## 2019-08-08 ENCOUNTER — Other Ambulatory Visit: Payer: Self-pay

## 2019-08-08 DIAGNOSIS — M7022 Olecranon bursitis, left elbow: Secondary | ICD-10-CM

## 2019-08-08 NOTE — Assessment & Plan Note (Signed)
Acute with slight worsening from previous visit.  He plans on going to [facility] walk-in first thing in morning, has to work today.  Have recommended resting area, not placing on arms of chairs, support to area as needed.  May take Tylenol as needed, minimally, and apply ice.  Return in 6 days.  Have recommended calling provider tomorrow to update on ortho visit.

## 2019-08-08 NOTE — Progress Notes (Addendum)
There were no vitals taken for this visit.   Subjective:    Patient ID: Earl Baker, male    DOB: 27-Mar-1966, 53 y.o.   MRN: [id number]  HPI: Earl Baker is a 53 y.o. male  Chief Complaint  Patient presents with  . Elbow Pain    6 week f/up    . This visit was completed via Doximity due to the restrictions of the COVID-19 pandemic. All issues as above were discussed and addressed. Physical exam was done as above through visual confirmation on Doximity. If it was felt that the patient should be evaluated in the office, they were directed there. The patient verbally consented to this visit. . Location of the patient: home . Location of the provider: home . Those involved with this call:  . Provider: Marnee Guarneri, DNP . CMA: Lesle Chris, Platter . Front Desk/Registration: Jill Side  . Time spent on call: 15 minutes with patient face to face via video conference. More than 50% of this time was spent in counseling and coordination of care. 10 minutes total spent in review of patient's record and preparation of their chart.  . I verified patient identity using two factors (patient name and date of birth). Patient consents verbally to being seen via telemedicine visit today.    ELBOWPAIN Started three weeks ago and started to be painful, was seen 07/24/19 and treated for olecranon bursitis with Keflex and prescribed steroid cream to assist with dry elbows. Had abx extended on 08/02/2019, area was improving but not 100%.  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 He reports today it is slightly more tender and red again, but not as big as initially was. Endorses he has not been resting like he should and has been Museum/gallery exhibitions officer. Has not been consistently applying ice. Duration:weeks Location: elbow Mechanism of injury:unknown Onset:gradual Severity:3/10 Quality:dull and aching Frequency:intermittent Radiation:no  Aggravating factors:bending Alleviating factors:nothing Status:fluctuating Treatments attempted:none Relief with NSAIDs?:No NSAIDs Taken Swelling:yes Redness:yes Warmth:yes Trauma:no Chest pain:no Shortness of breath:no Fever:no Decreased sensation:no Paresthesias:no Weakness:no  Relevant past medical, surgical, family and social history reviewed and updated as indicated. Interim medical history since our last visit reviewed. Allergies and medications reviewed and updated.  Review of Systems  Constitutional: Negative for activity change, diaphoresis, fatigue and fever.  Respiratory: Negative for cough, chest tightness, shortness of breath and wheezing.   Cardiovascular: Negative for chest pain, palpitations and leg swelling.  Gastrointestinal: Negative for abdominal distention, abdominal pain, constipation, diarrhea, nausea and vomiting.  Musculoskeletal: Positive for joint swelling.  Neurological: Negative.   Psychiatric/Behavioral: Negative.     Per HPI unless specifically indicated above     Objective:    There were no vitals taken for this visit.  Wt Readings from Last 3 Encounters:  08/02/19 179 lb (81.2 kg)  01/03/19 193 lb (87.5 kg)  12/03/18 198 lb (89.8 kg)    Physical Exam Vitals signs and nursing note reviewed.  Constitutional:      General: He is awake. He is not in acute distress.    Appearance: He is well-developed. He is not ill-appearing.  HENT:     Head: Normocephalic.     Right Ear: Hearing normal. No drainage.     Left Ear: Hearing normal. No drainage.  Eyes:     General: Lids are normal.        Right eye: No discharge.        Left eye: No discharge.  Conjunctiva/sclera: Conjunctivae normal.  Neck:     Musculoskeletal: Normal range of motion.  Cardiovascular:     Comments: Unable to auscultate due to virtual exam only Pulmonary:     Effort: Pulmonary effort is normal. No accessory muscle usage or respiratory  distress.     Comments: Unable to auscultate due to virtual exam only  Musculoskeletal:     Right elbow: He exhibits normal range of motion, no swelling and no laceration.     Left elbow: He exhibits swelling. He exhibits normal range of motion and no laceration. Tenderness found. Olecranon process tenderness noted.     Comments: Left elbow with mild edema over olecranon process + erythema and warmth per patient report.  Mild tenderness with patient self palpation of area.  Full ROM of elbow without difficulty.   Neurological:     Mental Status: He is alert and oriented to person, place, and time.  Psychiatric:        Mood and Affect: Mood normal.        Behavior: Behavior normal. Behavior is cooperative.        Thought Content: Thought content normal.        Judgment: Judgment normal.     Results for orders placed or performed in visit on 08/02/19  CBC with Differential/Platelet  Result Value Ref Range   WBC 7.9 3.4 - 10.8 x10E3/uL   RBC 5.40 4.14 - 5.80 x10E6/uL   Hemoglobin 15.4 13.0 - 17.7 g/dL   Hematocrit 46.4 37.5 - 51.0 %   MCV 86 79 - 97 fL   MCH 28.5 26.6 - 33.0 pg   MCHC 33.2 31.5 - 35.7 g/dL   RDW 13.0 11.6 - 15.4 %   Platelets 277 150 - 450 x10E3/uL   Neutrophils 48 Not Estab. %   Lymphs 40 Not Estab. %   Monocytes 8 Not Estab. %   Eos 3 Not Estab. %   Basos 1 Not Estab. %   Neutrophils Absolute 3.7 1.4 - 7.0 x10E3/uL   Lymphocytes Absolute 3.2 (H) 0.7 - 3.1 x10E3/uL   Monocytes Absolute 0.6 0.1 - 0.9 x10E3/uL   EOS (ABSOLUTE) 0.3 0.0 - 0.4 x10E3/uL   Basophils Absolute 0.1 0.0 - 0.2 x10E3/uL   Immature Granulocytes 0 Not Estab. %   Immature Grans (Abs) 0.0 0.0 - 0.1 x10E3/uL  Uric acid  Result Value Ref Range   Uric Acid 6.1 3.7 - 8.6 mg/dL      Assessment & Plan:   Problem List Items Addressed This Visit      Musculoskeletal and Integument   Olecranon bursitis    Acute with slight worsening from previous visit.  He plans on going to [facility]  walk-in first thing in morning, has to work today.  Have recommended resting area, not placing on arms of chairs, support to area as needed.  May take Tylenol as needed, minimally, and apply ice.  Return in 6 days.  Have recommended calling provider tomorrow to update on ortho visit.         I discussed the assessment and treatment plan with the patient. The patient was provided an opportunity to ask questions and all were answered. The patient agreed with the plan and demonstrated an understanding of the instructions.   The patient was advised to call back or seek an in-person evaluation if the symptoms worsen or if the condition fails to improve as anticipated.   I provided 15 minutes of time during this encounter.  Follow  up plan: Return if symptoms worsen or fail to improve.

## 2019-08-08 NOTE — Patient Instructions (Signed)
Elbow Bursitis Rehab Ask your health care provider which exercises are safe for you. Do exercises exactly as told by your health care provider and adjust them as directed. It is normal to feel mild stretching, pulling, tightness, or discomfort as you do these exercises. Stop right away if you feel sudden pain or your pain gets worse. Do not begin these exercises until told by your health care provider. Stretching and range-of-motion exercises These exercises warm up your muscles and joints and improve the movement and flexibility of your elbow. The exercises also help to relieve pain and swelling. Elbow flexion, assisted 1. Stand or sit with your left / right arm at your side. 2. Use your other hand to gently push your left / right hand toward your shoulder (assisted) while bending your elbow (flexion). 3. Hold this position for __________ seconds. 4. Slowly return your left / right arm to the starting position. Repeat __________ times. Complete this exercise __________ times a day. Elbow extension, assisted 1. Lie on your back in a comfortable position that allows you to relax your arm muscles. 2. Place a folded towel under your left / right upper arm so that your elbow and shoulder are at the same height. 3. Use your other arm to raise your left / right arm (assisted) until your elbow and hand do not rest on the bed or towel. Hold your left / right arm out straight with your other hand supporting it. 4. Let the weight of your hand straighten your elbow (extension). You should feel a stretch on the inside of your elbow. ? Keep your arm and chest muscles relaxed. ? If directed, add a small wrist weight or hand weight to increase the stretch. 5. Hold this position for __________ seconds. 6. Slowly release the stretch and return to the starting position. Repeat __________ times. Complete this exercise __________ times a day. Elbow flexion, active 1. Stand or sit with your left / right elbow bent  and your palm facing in, toward your body. 2. Bend your elbow as far as you can using only your arm muscles (active flexion). 3. Hold this position for __________ seconds. 4. Slowly return to the starting position. Repeat __________ times. Complete this exercise __________ times a day. Elbow extension, active 1. Stand or sit with your left / right elbow bent and your palm facing in, toward your body. 2. Slowly straighten your elbow using only your arm muscles (active extension). Stop when you feel a gentle stretch at the front of your arm, or when your arm is straight. 3. Hold this position for __________ seconds. 4. Slowly return to the starting position. Repeat __________ times. Complete this exercise __________ times a day. Strengthening exercises These exercises build strength and endurance in your elbow. Endurance is the ability to use your muscles for a long time, even after they get tired. Elbow flexion, isometric 1. Stand or sit with your left / right arm at waist height. Your palm should face in, toward your body. 2. Place your other hand on top of your left / right forearm. Gently push down while you resist with your left / right arm (isometric flexion). ? Use about 50% effort with both arms. You may be instructed to use more and more effort with your arms each week. ? Try not to let your left / right arm move during the exercise. 3. Hold this position for __________ seconds. 4. Let your muscles relax completely before you repeat the exercise. Repeat __________ times.  Complete this exercise __________ times a day. Elbow extension, isometric  1. Stand or sit with your left / right arm at waist height. Your palm should face in, toward your body. 2. Place your other hand on the bottom of your left / right forearm. Gently push up while you resist with your left / right arm (isometric extension). ? Use about 50% effort with both arms. You may be instructed to use more and more effort  with your arms each week. ? Try not to let your left / right arm move during the exercise. 3. Hold this position for __________ seconds. 4. Let your muscles relax completely before you repeat the exercise. Repeat __________ times. Complete this exercise __________ times a day. Biceps curls 1. Sit on a stable chair without armrests, or stand up. 2. Hold a _________ weight in your left / right hand. Your palm should face out, away from your body, at the starting position. 3. Bend your left / right elbow and move your hand up toward your shoulder. Keep your elbow at your side while you bend it. 4. Slowly return to the starting position. Repeat __________ times. Complete this exercise __________ times a day. Triceps curls  1. Lie on your back. 2. Hold a _________ weight in your left / right hand. 3. Bend your left / right elbow to a 90-degree angle (right angle), so the weight is in front of your face, over your chest, and your elbow is pointed up to the ceiling. 4. Straighten your elbow, raising your hand toward the ceiling. Use your other hand to support your left / right upper arm and to keep it still. 5. Slowly return to the starting position. Repeat __________ times. Complete this exercise __________ times a day. This information is not intended to replace advice given to you by your health care provider. Make sure you discuss any questions you have with your health care provider. Document Released: 12/05/2005 Document Revised: 03/28/2019 Document Reviewed: 12/26/2018 Elsevier Patient Education  2020 [employer].

## 2019-08-09 DIAGNOSIS — M7022 Olecranon bursitis, left elbow: Secondary | ICD-10-CM | POA: Diagnosis not present

## 2019-08-23 DIAGNOSIS — G4733 Obstructive sleep apnea (adult) (pediatric): Secondary | ICD-10-CM | POA: Diagnosis not present

## 2019-09-02 DIAGNOSIS — G4733 Obstructive sleep apnea (adult) (pediatric): Secondary | ICD-10-CM | POA: Diagnosis not present

## 2019-09-02 DIAGNOSIS — E669 Obesity, unspecified: Secondary | ICD-10-CM | POA: Diagnosis not present

## 2019-09-02 DIAGNOSIS — G2581 Restless legs syndrome: Secondary | ICD-10-CM | POA: Diagnosis not present

## 2019-09-06 ENCOUNTER — Ambulatory Visit (INDEPENDENT_AMBULATORY_CARE_PROVIDER_SITE_OTHER): Payer: BC Managed Care – PPO | Admitting: Family Medicine

## 2019-09-06 ENCOUNTER — Encounter: Payer: Self-pay | Admitting: Family Medicine

## 2019-09-06 ENCOUNTER — Other Ambulatory Visit: Payer: Self-pay

## 2019-09-06 VITALS — BP 111/76 | HR 60 | Temp 97.9°F | Ht 66.0 in | Wt 179.0 lb

## 2019-09-06 DIAGNOSIS — R109 Unspecified abdominal pain: Secondary | ICD-10-CM

## 2019-09-06 LAB — CBC WITH DIFFERENTIAL/PLATELET
Hematocrit: 43.7 % (ref 37.5–51.0)
Hemoglobin: 15.4 g/dL (ref 13.0–17.7)
Lymphocytes Absolute: 3.2 10*3/uL — ABNORMAL HIGH (ref 0.7–3.1)
Lymphs: 40 %
MCH: 30.1 pg (ref 26.6–33.0)
MCHC: 35.2 g/dL (ref 31.5–35.7)
MCV: 86 fL (ref 79–97)
MID (Absolute): 0.8 10*3/uL (ref 0.1–1.6)
MID: 10 %
Neutrophils Absolute: 4.1 10*3/uL (ref 1.4–7.0)
Neutrophils: 50 %
Platelets: 217 10*3/uL (ref 150–450)
RBC: 5.11 x10E6/uL (ref 4.14–5.80)
RDW: 14.9 % (ref 11.6–15.4)
WBC: 8.1 10*3/uL (ref 3.4–10.8)

## 2019-09-06 LAB — UA/M W/RFLX CULTURE, ROUTINE
Bilirubin, UA: NEGATIVE
Glucose, UA: NEGATIVE
Ketones, UA: NEGATIVE
Leukocytes,UA: NEGATIVE
Nitrite, UA: NEGATIVE
Protein,UA: NEGATIVE
RBC, UA: NEGATIVE
Specific Gravity, UA: <1.005 — ABNORMAL LOW (ref 1.005–1.030)
Urobilinogen, Ur: 0.2 mg/dL (ref 0.2–1.0)
pH, UA: 6.5 (ref 5.0–7.5)

## 2019-09-06 NOTE — Progress Notes (Signed)
BP 111/76   Pulse 60   Temp 97.9 F (36.6 C) (Oral)   Ht 5\' 6"  (1.676 m)   Wt 179 lb (81.2 kg)   SpO2 97%   BMI 28.89 kg/m    Subjective:    Patient ID: Earl Baker, male    DOB: 26-Dec-1965, 53 y.o.   MRN: [id number]  HPI: Earl Baker is a 53 y.o. male  Chief Complaint  Patient presents with  . Pain    waiste level, right side front and back. ongoing 2 weeks   Patient presenting today with 2 weeks of dull pain lower right side of abdomen shooting toward back. Worse with certain positions, including bending over where he will feel some pulling sensation into back. No known injury or trigger, has never had this pain in the past. Denies N/V/D, constipation, fevers, chills, recent illness, dysuria, hematuria. Has not been trying anything for relief. Does have a hx of low back issues on the right side. Has had appendectomy in past. Normal colonoscopy 12/2018 other than some benign polyps.   Relevant past medical, surgical, family and social history reviewed and updated as indicated. Interim medical history since our last visit reviewed. Allergies and medications reviewed and updated.  Review of Systems  Per HPI unless specifically indicated above     Objective:    BP 111/76   Pulse 60   Temp 97.9 F (36.6 C) (Oral)   Ht 5\' 6"  (1.676 m)   Wt 179 lb (81.2 kg)   SpO2 97%   BMI 28.89 kg/m   Wt Readings from Last 3 Encounters:  09/06/19 179 lb (81.2 kg)  08/02/19 179 lb (81.2 kg)  01/03/19 193 lb (87.5 kg)    Physical Exam Vitals signs and nursing note reviewed.  Constitutional:      Appearance: Normal appearance.  HENT:     Head: Atraumatic.  Eyes:     Extraocular Movements: Extraocular movements intact.     Conjunctiva/sclera: Conjunctivae normal.  Neck:     Musculoskeletal: Normal range of motion and neck supple.  Cardiovascular:     Rate and Rhythm: Normal rate and regular rhythm.  Pulmonary:     Effort: Pulmonary effort is normal.     Breath  sounds: Normal breath sounds.  Abdominal:     General: Bowel sounds are normal. There is no distension.     Palpations: Abdomen is soft. There is no mass.     Tenderness: There is no abdominal tenderness. There is no right CVA tenderness, left CVA tenderness or guarding.  Musculoskeletal: Normal range of motion.        General: No tenderness.  Skin:    General: Skin is warm and dry.  Neurological:     General: No focal deficit present.     Mental Status: He is oriented to person, place, and time.  Psychiatric:        Mood and Affect: Mood normal.        Thought Content: Thought content normal.        Judgment: Judgment normal.     Results for orders placed or performed in visit on 09/06/19  CBC With Differential/Platelet  Result Value Ref Range   WBC 8.1 3.4 - 10.8 x10E3/uL   RBC 5.11 4.14 - 5.80 x10E6/uL   Hemoglobin 15.4 13.0 - 17.7 g/dL   Hematocrit 43.7 37.5 - 51.0 %   MCV 86 79 - 97 fL   MCH 30.1 26.6 - 33.0 pg  MCHC 35.2 31.5 - 35.7 g/dL   RDW 14.9 11.6 - 15.4 %   Platelets 217 150 - 450 x10E3/uL   Neutrophils 50 Not Estab. %   Lymphs 40 Not Estab. %   MID 10 Not Estab. %   Neutrophils Absolute 4.1 1.4 - 7.0 x10E3/uL   Lymphocytes Absolute 3.2 (H) 0.7 - 3.1 x10E3/uL   MID (Absolute) 0.8 0.1 - 1.6 X10E3/uL  UA/M w/rflx Culture, Routine   Specimen: Blood   BLD  Result Value Ref Range   Specific Gravity, UA <1.005 (L) 1.005 - 1.030   pH, UA 6.5 5.0 - 7.5   Color, UA Yellow Yellow   Appearance Ur Clear Clear   Leukocytes,UA Negative Negative   Protein,UA Negative Negative/Trace   Glucose, UA Negative Negative   Ketones, UA Negative Negative   RBC, UA Negative Negative   Bilirubin, UA Negative Negative   Urobilinogen, Ur 0.2 0.2 - 1.0 mg/dL   Nitrite, UA Negative Negative      Assessment & Plan:   Problem List Items Addressed This Visit    None    Visit Diagnoses    Right flank pain    -  Primary   U/A, CBC, exam and vitals benign. Possibly muscle  strain with abnormal presentation but obtain abdominal u/s given persistence and pt concern.    Relevant Orders   CBC With Differential/Platelet (Completed)   UA/M w/rflx Culture, Routine (Completed)   US Abdomen Complete     Supportive care and return precautions reviewed at length.   Follow up plan: Return if symptoms worsen or fail to improve.

## 2019-09-16 ENCOUNTER — Other Ambulatory Visit: Payer: Self-pay

## 2019-09-16 ENCOUNTER — Ambulatory Visit
Admission: RE | Admit: 2019-09-16 | Discharge: 2019-09-16 | Disposition: A | Discharge status: Home / Self Care | Payer: BC Managed Care – PPO | Source: Ambulatory Visit | Attending: Family Medicine | Admitting: Family Medicine

## 2019-09-16 DIAGNOSIS — K7689 Other specified diseases of liver: Secondary | ICD-10-CM | POA: Diagnosis not present

## 2019-09-16 DIAGNOSIS — R109 Unspecified abdominal pain: Secondary | ICD-10-CM | POA: Insufficient documentation

## 2019-09-18 ENCOUNTER — Telehealth: Payer: Self-pay | Admitting: Family Medicine

## 2019-09-18 DIAGNOSIS — R748 Abnormal levels of other serum enzymes: Secondary | ICD-10-CM

## 2019-09-18 DIAGNOSIS — K769 Liver disease, unspecified: Secondary | ICD-10-CM

## 2019-09-18 DIAGNOSIS — R160 Hepatomegaly, not elsewhere classified: Secondary | ICD-10-CM

## 2019-09-18 NOTE — Telephone Encounter (Signed)
Called pt to discuss abnormal liver findings on recent u/s report with recommendation for CT abdomen with contrast. Patient agreeable to going forward with this imaging study, order placed today. Order also placed to have CMP drawn as his study is with contrast and last Cr on file from 11/2018. Pt to come next week when he's back from vacation to have labs drawn prior to imaging study. Questions answered in full.

## 2019-09-22 DIAGNOSIS — G4733 Obstructive sleep apnea (adult) (pediatric): Secondary | ICD-10-CM | POA: Diagnosis not present

## 2019-09-23 ENCOUNTER — Ambulatory Visit: Payer: BC Managed Care – PPO

## 2019-09-24 ENCOUNTER — Other Ambulatory Visit: Payer: Self-pay

## 2019-09-24 DIAGNOSIS — Z20822 Contact with and (suspected) exposure to covid-19: Secondary | ICD-10-CM

## 2019-09-24 DIAGNOSIS — Z20828 Contact with and (suspected) exposure to other viral communicable diseases: Secondary | ICD-10-CM | POA: Diagnosis not present

## 2019-09-25 ENCOUNTER — Other Ambulatory Visit: Payer: Self-pay | Admitting: Family Medicine

## 2019-09-25 DIAGNOSIS — K769 Liver disease, unspecified: Secondary | ICD-10-CM

## 2019-09-26 ENCOUNTER — Ambulatory Visit: Admission: RE | Admit: 2019-09-26 | Payer: BC Managed Care – PPO | Source: Ambulatory Visit

## 2019-09-26 LAB — NOVEL CORONAVIRUS, NAA: SARS-CoV-2, NAA: DETECTED — AB

## 2019-10-15 ENCOUNTER — Ambulatory Visit
Admission: RE | Admit: 2019-10-15 | Discharge: 2019-10-15 | Disposition: A | Discharge status: Home / Self Care | Payer: BC Managed Care – PPO | Source: Ambulatory Visit | Attending: Family Medicine | Admitting: Family Medicine

## 2019-10-15 ENCOUNTER — Other Ambulatory Visit: Payer: Self-pay

## 2019-10-15 DIAGNOSIS — N400 Enlarged prostate without lower urinary tract symptoms: Secondary | ICD-10-CM | POA: Diagnosis not present

## 2019-10-15 DIAGNOSIS — K769 Liver disease, unspecified: Secondary | ICD-10-CM | POA: Diagnosis not present

## 2019-10-15 MED ORDER — IOHEXOL 300 MG/ML  SOLN
100.0000 mL | Freq: Once | INTRAMUSCULAR | Status: AC | PRN
  Administered 2019-10-15: 100 mL via INTRAVENOUS

## 2019-10-23 DIAGNOSIS — G4733 Obstructive sleep apnea (adult) (pediatric): Secondary | ICD-10-CM | POA: Diagnosis not present

## 2019-11-22 DIAGNOSIS — G4733 Obstructive sleep apnea (adult) (pediatric): Secondary | ICD-10-CM | POA: Diagnosis not present

## 2019-12-09 ENCOUNTER — Ambulatory Visit: Payer: BC Managed Care – PPO | Admitting: Family Medicine

## 2019-12-11 ENCOUNTER — Ambulatory Visit: Payer: BC Managed Care – PPO | Admitting: Family Medicine

## 2020-01-20 ENCOUNTER — Encounter: Payer: Self-pay | Admitting: Family Medicine

## 2020-01-20 ENCOUNTER — Ambulatory Visit (INDEPENDENT_AMBULATORY_CARE_PROVIDER_SITE_OTHER): Payer: 59 | Admitting: Family Medicine

## 2020-01-20 ENCOUNTER — Other Ambulatory Visit: Payer: Self-pay

## 2020-01-20 VITALS — BP 122/80 | HR 69 | Temp 98.2°F | Ht 65.63 in | Wt 188.5 lb

## 2020-01-20 DIAGNOSIS — R748 Abnormal levels of other serum enzymes: Secondary | ICD-10-CM

## 2020-01-20 DIAGNOSIS — Z0000 Encounter for general adult medical examination without abnormal findings: Secondary | ICD-10-CM | POA: Diagnosis not present

## 2020-01-20 DIAGNOSIS — F1020 Alcohol dependence, uncomplicated: Secondary | ICD-10-CM | POA: Diagnosis not present

## 2020-01-20 DIAGNOSIS — IMO0001 Reserved for inherently not codable concepts without codable children: Secondary | ICD-10-CM

## 2020-01-20 DIAGNOSIS — Z23 Encounter for immunization: Secondary | ICD-10-CM | POA: Diagnosis not present

## 2020-01-20 DIAGNOSIS — E039 Hypothyroidism, unspecified: Secondary | ICD-10-CM | POA: Diagnosis not present

## 2020-01-20 DIAGNOSIS — E785 Hyperlipidemia, unspecified: Secondary | ICD-10-CM | POA: Diagnosis not present

## 2020-01-20 MED ORDER — DISULFIRAM 250 MG PO TABS: 250.0000 mg | ORAL_TABLET | Freq: Every day | ORAL | 3 refills | Status: DC

## 2020-01-20 NOTE — Progress Notes (Signed)
BP 122/80   Pulse 69   Temp 98.2 F (36.8 C)   Ht 5' 5.63" (1.667 m)   Wt 188 lb 8 oz (85.5 kg)   SpO2 97%   BMI 30.77 kg/m    Subjective:    Patient ID: Earl Baker, male    DOB: 08-30-1966, 54 y.o.   MRN: [id number]  HPI: Earl Baker is a 54 y.o. male presenting on 01/20/2020 for comprehensive medical examination. Current medical complaints include:  HYPERLIPIDEMIA Hyperlipidemia status: stable Satisfied with current treatment?  yes Side effects:  Not on anything Supplements: fish oil Aspirin:  yes The 10-year ASCVD risk score Mikey Bussing DC Jr., et al., 2013) is: 8.1%   Values used to calculate the score:     Age: 66 years     Sex: Male     Is Non-Hispanic African American: No     Diabetic: No     Tobacco smoker: Yes     Systolic Blood Pressure: 123XX123 mmHg     Is BP treated: No     HDL Cholesterol: 43 mg/dL     Total Cholesterol: 180 mg/dL Chest pain:  no Coronary artery disease:  no  HYPOTHYROIDISM Thyroid control status:stable Satisfied with current treatment? yes Medication side effects: no Medication compliance: excellent compliance Recent dose adjustment:no Fatigue: yes Cold intolerance: no Heat intolerance: no Weight gain: no Weight loss: no Constipation: no Diarrhea/loose stools: no Palpitations: no Lower extremity edema: no Anxiety/depressed mood: no  Interim Problems from his last visit: no  Depression Screen done today and results listed below:  Depression screen [facility] 2/9 01/20/2020 12/03/2018 05/29/2017 05/25/2016  Decreased Interest 0 0 1 2  Down, Depressed, Hopeless 0 0 1 2  PHQ - 2 Score 0 0 2 4  Altered sleeping - 0 - 2  Tired, decreased energy - 1 - 3  Change in appetite - 1 - 2  Feeling bad or failure about yourself  - 0 - 2  Trouble concentrating - 0 - 1  Moving slowly or fidgety/restless - 0 - 1  Suicidal thoughts - 0 - 1  PHQ-9 Score - 2 - 16     Past Medical History:  Past Medical History:  Diagnosis Date  .  Alcoholism /alcohol abuse ([place]) 09/22/2015  . Arthritis   . Hep C w/o coma, chronic (HCC)    Treated.  Pre 2010.  Marland Kitchen Hyperlipidemia   . Hypothyroidism   . Low back pain   . Olecranon bursitis 07/24/2019  . Substance abuse [facility])     Surgical History:  Past Surgical History:  Procedure Laterality Date  . APPENDECTOMY    . COLONOSCOPY WITH PROPOFOL N/A 01/03/2019   Procedure: COLONOSCOPY WITH PROPOFOL;  Surgeon: Lucilla Lame, MD;  Location: [place];  Service: Endoscopy;  Laterality: N/A;  . POLYPECTOMY  01/03/2019   Procedure: POLYPECTOMY;  Surgeon: Lucilla Lame, MD;  Location: [place];  Service: Endoscopy;;    Medications:  Current Outpatient Medications on File Prior to Visit  Medication Sig  . ASPIRIN 81 PO Take by mouth.  . levothyroxine (SYNTHROID, LEVOTHROID) 75 MCG tablet Take 1 tablet (75 mcg total) by mouth daily before breakfast.  . Multiple Vitamins-Minerals (MULTIVITAMIN ADULT PO) Take by mouth.  . Omega-3 Fatty Acids (FISH OIL PO) Take by mouth.   No current facility-administered medications on file prior to visit.    Allergies:  No Known Allergies  Social History:  Social History   Socioeconomic History  . Marital  status: Single    Spouse name: Not on file  . Number of children: Not on file  . Years of education: Not on file  . Highest education level: Not on file  Occupational History  . Not on file  Tobacco Use  . Smoking status: Current Every Day Smoker    Packs/day: 0.25    Years: 30.00    Pack years: 7.50    Types: Cigarettes  . Smokeless tobacco: Never Used  Substance and Sexual Activity  . Alcohol use: Not Currently    Alcohol/week: 36.0 standard drinks    Types: 36 Cans of beer per week    Comment: weekends - Pt reports none in over 1 week  . Drug use: Yes    Types: Cocaine    Comment: pt reports none in over 1 month  . Sexual activity: Not on file  Other Topics Concern  . Not on file  Social History Narrative  .  Not on file   Social Determinants of Health   Financial Resource Strain:   . Difficulty of Paying Living Expenses: Not on file  Food Insecurity:   . Worried About Charity fundraiser in the Last Year: Not on file  . Ran Out of Food in the Last Year: Not on file  Transportation Needs:   . Lack of Transportation (Medical): Not on file  . Lack of Transportation (Non-Medical): Not on file  Physical Activity:   . Days of Exercise per Week: Not on file  . Minutes of Exercise per Session: Not on file  Stress:   . Feeling of Stress : Not on file  Social Connections:   . Frequency of Communication with Friends and Family: Not on file  . Frequency of Social Gatherings with Friends and Family: Not on file  . Attends Religious Services: Not on file  . Active Member of Clubs or Organizations: Not on file  . Attends Archivist Meetings: Not on file  . Marital Status: Not on file  Intimate Partner Violence:   . Fear of Current or Ex-Partner: Not on file  . Emotionally Abused: Not on file  . Physically Abused: Not on file  . Sexually Abused: Not on file   Social History   Tobacco Use  Smoking Status Current Every Day Smoker  . Packs/day: 0.25  . Years: 30.00  . Pack years: 7.50  . Types: Cigarettes  Smokeless Tobacco Never Used   Social History   Substance and Sexual Activity  Alcohol Use Not Currently  . Alcohol/week: 36.0 standard drinks  . Types: 36 Cans of beer per week   Comment: weekends - Pt reports none in over 1 week    Family History:  Family History  Problem Relation Age of Onset  . Hyperlipidemia Father   . Cancer Father        prostate  . Alcohol abuse Brother     Past medical history, surgical history, medications, allergies, family history and social history reviewed with patient today and changes made to appropriate areas of the chart.   Review of Systems  Constitutional: Negative.   HENT: Negative.   Eyes: Negative.   Respiratory: Negative.    Cardiovascular: Negative.   Gastrointestinal: Negative.   Genitourinary: Negative.   Musculoskeletal: Negative.   Skin: Negative.   Neurological: Negative.   Endo/Heme/Allergies: Positive for polydipsia. Negative for environmental allergies. Does not bruise/bleed easily.  Psychiatric/Behavioral: Negative.     All other ROS negative except what is listed above  and in the HPI.      Objective:    BP 122/80   Pulse 69   Temp 98.2 F (36.8 C)   Ht 5' 5.63" (1.667 m)   Wt 188 lb 8 oz (85.5 kg)   SpO2 97%   BMI 30.77 kg/m   Wt Readings from Last 3 Encounters:  01/20/20 188 lb 8 oz (85.5 kg)  09/06/19 179 lb (81.2 kg)  08/02/19 179 lb (81.2 kg)    Physical Exam Vitals and nursing note reviewed.  Constitutional:      General: He is not in acute distress.    Appearance: Normal appearance. He is obese. He is not ill-appearing, toxic-appearing or diaphoretic.  HENT:     Head: Normocephalic and atraumatic.     Right Ear: Tympanic membrane, ear canal and external ear normal. There is no impacted cerumen.     Left Ear: Tympanic membrane, ear canal and external ear normal. There is no impacted cerumen.     Nose: Nose normal. No congestion or rhinorrhea.     Mouth/Throat:     Mouth: Mucous membranes are moist.     Pharynx: Oropharynx is clear. No oropharyngeal exudate or posterior oropharyngeal erythema.  Eyes:     General: No scleral icterus.       Right eye: No discharge.        Left eye: No discharge.     Extraocular Movements: Extraocular movements intact.     Conjunctiva/sclera: Conjunctivae normal.     Pupils: Pupils are equal, round, and reactive to light.  Neck:     Vascular: No carotid bruit.  Cardiovascular:     Rate and Rhythm: Normal rate and regular rhythm.     Pulses: Normal pulses.     Heart sounds: No murmur. No friction rub. No gallop.   Pulmonary:     Effort: Pulmonary effort is normal. No respiratory distress.     Breath sounds: Normal breath sounds. No  stridor. No wheezing, rhonchi or rales.  Chest:     Chest wall: No tenderness.  Abdominal:     General: Abdomen is flat. Bowel sounds are normal. There is no distension.     Palpations: Abdomen is soft. There is no mass.     Tenderness: There is no abdominal tenderness. There is no right CVA tenderness, left CVA tenderness, guarding or rebound.     Hernia: No hernia is present.  Genitourinary:    Comments: Genital exam deferred with shared decision making Musculoskeletal:        General: No swelling, tenderness, deformity or signs of injury.     Cervical back: Normal range of motion and neck supple. No rigidity. No muscular tenderness.     Right lower leg: No edema.     Left lower leg: No edema.  Lymphadenopathy:     Cervical: No cervical adenopathy.  Skin:    General: Skin is warm and dry.     Capillary Refill: Capillary refill takes less than 2 seconds.     Coloration: Skin is not jaundiced or pale.     Findings: No bruising, erythema, lesion or rash.  Neurological:     General: No focal deficit present.     Mental Status: He is alert and oriented to person, place, and time.     Cranial Nerves: No cranial nerve deficit.     Sensory: No sensory deficit.     Motor: No weakness.     Coordination: Coordination normal.     Gait:  Gait normal.     Deep Tendon Reflexes: Reflexes normal.  Psychiatric:        Mood and Affect: Mood normal.        Behavior: Behavior normal.        Thought Content: Thought content normal.        Judgment: Judgment normal.     Results for orders placed or performed in visit on 09/24/19  Novel Coronavirus, NAA ([facility])   Specimen: Oropharyngeal(OP) collection in vial transport medium   OROPHARYNGEA  TESTING  Result Value Ref Range   SARS-CoV-2, NAA Detected (A) Not Detected      Assessment & Plan:   Problem List Items Addressed This Visit      Endocrine   Hypothyroidism    Rechecking labs today. Await results. Treat as needed. Await  results.      Relevant Orders   CBC with Differential/Platelet   Comprehensive metabolic panel   TSH     Other   Alcohol abuse    Doing well on antabuse. Continue antabuse. Call with any concerns.       Relevant Medications   disulfiram (ANTABUSE) 250 MG tablet   Hyperlipidemia    Rechecking labs today. Await results. Treat as needed. Await results.      Relevant Orders   CBC with Differential/Platelet   Comprehensive metabolic panel   Lipid Panel w/o Chol/HDL Ratio    Other Visit Diagnoses    Routine general medical examination at a health care facility    -  Primary   Vaccines updated. Screening labs checked today. Colonoscopy up to date. Continue diet and exercise. Call with any concerns.    Relevant Orders   CBC with Differential/Platelet   Comprehensive metabolic panel   Lipid Panel w/o Chol/HDL Ratio   TSH   PSA   UA/M w/rflx Culture, Routine   Pneumococcal polysaccharide vaccine 23-valent greater than or equal to 2yo subcutaneous/IM   Elevated liver enzymes       Rechecking levels today. Await results.    Relevant Orders   CBC with Differential/Platelet   Comprehensive metabolic panel       Discussed aspirin prophylaxis for myocardial infarction prevention and decision was made to continue ASA  LABORATORY TESTING:  Health maintenance labs ordered today as discussed above.   The natural history of prostate cancer and ongoing controversy regarding screening and potential treatment outcomes of prostate cancer has been discussed with the patient. The meaning of a false positive PSA and a false negative PSA has been discussed. He indicates understanding of the limitations of this screening test and wishes to proceed with screening PSA testing.   IMMUNIZATIONS:   - Tdap: Tetanus vaccination status reviewed: last tetanus booster within 10 years. - Influenza: Up to date - Pneumovax: Administered today  SCREENING: - Colonoscopy: Up to date  Discussed with  patient purpose of the colonoscopy is to detect colon cancer at curable precancerous or early stages   PATIENT COUNSELING:    Sexuality: Discussed sexually transmitted diseases, partner selection, use of condoms, avoidance of unintended pregnancy  and contraceptive alternatives.   Advised to avoid cigarette smoking.  I discussed with the patient that most people either abstain from alcohol or drink within safe limits (<=14/week and <=4 drinks/occasion for males, <=7/weeks and <= 3 drinks/occasion for females) and that the risk for alcohol disorders and other health effects rises proportionally with the number of drinks per week and how often a drinker exceeds daily limits.  Discussed cessation/primary prevention  of drug use and availability of treatment for abuse.   Diet: Encouraged to adjust caloric intake to maintain  or achieve ideal body weight, to reduce intake of dietary saturated fat and total fat, to limit sodium intake by avoiding high sodium foods and not adding table salt, and to maintain adequate dietary potassium and calcium preferably from fresh fruits, vegetables, and low-fat dairy products.    stressed the importance of regular exercise  Injury prevention: Discussed safety belts, safety helmets, smoke detector, smoking near bedding or upholstery.   Dental health: Discussed importance of regular tooth brushing, flossing, and dental visits.   Follow up plan: NEXT PREVENTATIVE PHYSICAL DUE IN 1 YEAR. Return in about 1 year (around 01/19/2021) for physical.

## 2020-01-20 NOTE — Assessment & Plan Note (Signed)
Doing well on antabuse. Continue antabuse. Call with any concerns.

## 2020-01-20 NOTE — Assessment & Plan Note (Signed)
Rechecking labs today. Await results. Treat as needed. Await results.  

## 2020-01-20 NOTE — Patient Instructions (Addendum)
Health Maintenance, Male Adopting a healthy lifestyle and getting preventive care are important in promoting health and wellness. Ask your health care provider about:  The right schedule for you to have regular tests and exams.  Things you can do on your own to prevent diseases and keep yourself healthy. What should I know about diet, weight, and exercise? Eat a healthy diet   Eat a diet that includes plenty of vegetables, fruits, low-fat dairy products, and lean protein.  Do not eat a lot of foods that are high in solid fats, added sugars, or sodium. Maintain a healthy weight Body mass index (BMI) is a measurement that can be used to identify possible weight problems. It estimates body fat based on height and weight. Your health care provider can help determine your BMI and help you achieve or maintain a healthy weight. Get regular exercise Get regular exercise. This is one of the most important things you can do for your health. Most adults should:  Exercise for at least 150 minutes each week. The exercise should increase your heart rate and make you sweat (moderate-intensity exercise).  Do strengthening exercises at least twice a week. This is in addition to the moderate-intensity exercise.  Spend less time sitting. Even light physical activity can be beneficial. Watch cholesterol and blood lipids Have your blood tested for lipids and cholesterol at 53 years of age, then have this test every 5 years. You may need to have your cholesterol levels checked more often if:  Your lipid or cholesterol levels are high.  You are older than 53 years of age.  You are at high risk for heart disease. What should I know about cancer screening? Many types of cancers can be detected early and may often be prevented. Depending on your health history and family history, you may need to have cancer screening at various ages. This may include screening for:  Colorectal cancer.  Prostate  cancer.  Skin cancer.  Lung cancer. What should I know about heart disease, diabetes, and high blood pressure? Blood pressure and heart disease  High blood pressure causes heart disease and increases the risk of stroke. This is more likely to develop in people who have high blood pressure readings, are of African descent, or are overweight.  Talk with your health care provider about your target blood pressure readings.  Have your blood pressure checked: ? Every 3-5 years if you are 18-39 years of age. ? Every year if you are 40 years old or older.  If you are between the ages of 65 and 75 and are a current or former smoker, ask your health care provider if you should have a one-time screening for abdominal aortic aneurysm (AAA). Diabetes Have regular diabetes screenings. This checks your fasting blood sugar level. Have the screening done:  Once every three years after age 45 if you are at a normal weight and have a low risk for diabetes.  More often and at a younger age if you are overweight or have a high risk for diabetes. What should I know about preventing infection? Hepatitis B If you have a higher risk for hepatitis B, you should be screened for this virus. Talk with your health care provider to find out if you are at risk for hepatitis B infection. Hepatitis C Blood testing is recommended for:  Everyone born from 1945 through 1965.  Anyone with known risk factors for hepatitis C. Sexually transmitted infections (STIs)  You should be screened each year   for STIs, including gonorrhea and chlamydia, if: ? You are sexually active and are younger than 53 years of age. ? You are older than 53 years of age and your health care provider tells you that you are at risk for this type of infection. ? Your sexual activity has changed since you were last screened, and you are at increased risk for chlamydia or gonorrhea. Ask your health care provider if you are at risk.  Ask your  health care provider about whether you are at high risk for HIV. Your health care provider may recommend a prescription medicine to help prevent HIV infection. If you choose to take medicine to prevent HIV, you should first get tested for HIV. You should then be tested every 3 months for as long as you are taking the medicine. Follow these instructions at home: Lifestyle  Do not use any products that contain nicotine or tobacco, such as cigarettes, e-cigarettes, and chewing tobacco. If you need help quitting, ask your health care provider.  Do not use street drugs.  Do not share needles.  Ask your health care provider for help if you need support or information about quitting drugs. Alcohol use  Do not drink alcohol if your health care provider tells you not to drink.  If you drink alcohol: ? Limit how much you have to 0-2 drinks a day. ? Be aware of how much alcohol is in your drink. In the [place] one drink equals one 12 oz bottle of beer (355 mL), one 5 oz glass of wine (148 mL), or one 1 oz glass of hard liquor (44 mL). General instructions  Schedule regular health, dental, and eye exams.  Stay current with your vaccines.  Tell your health care provider if: ? You often feel depressed. ? You have ever been abused or do not feel safe at home. Summary  Adopting a healthy lifestyle and getting preventive care are important in promoting health and wellness.  Follow your health care provider's instructions about healthy diet, exercising, and getting tested or screened for diseases.  Follow your health care provider's instructions on monitoring your cholesterol and blood pressure. This information is not intended to replace advice given to you by your health care provider. Make sure you discuss any questions you have with your health care provider. Document Revised: 11/28/2018 Document Reviewed: 11/28/2018 Elsevier Patient Education  [place]. Pneumococcal Polysaccharide  Vaccine (PPSV23): What You Need to Know 1. Why get vaccinated? Pneumococcal polysaccharide vaccine (PPSV23) can prevent pneumococcal disease. Pneumococcal disease refers to any illness caused by pneumococcal bacteria. These bacteria can cause many types of illnesses, including pneumonia, which is an infection of the lungs. Pneumococcal bacteria are one of the most common causes of pneumonia. Besides pneumonia, pneumococcal bacteria can also cause:  Ear infections  Sinus infections  Meningitis (infection of the tissue covering the brain and spinal cord)  Bacteremia (bloodstream infection) Anyone can get pneumococcal disease, but children under 61 years of age, people with certain medical conditions, adults 68 years or older, and cigarette smokers are at the highest risk. Most pneumococcal infections are mild. However, some can result in long-term problems, such as brain damage or hearing loss. Meningitis, bacteremia, and pneumonia caused by pneumococcal disease can be fatal. 2. PPSV23 PPSV23 protects against 23 types of bacteria that cause pneumococcal disease. PPSV23 is recommended for:  All adults 40 years or older,  Anyone 2 years or older with certain medical conditions that can lead to an increased risk  for pneumococcal disease. Most people need only one dose of PPSV23. A second dose of PPSV23, and another type of pneumococcal vaccine called PCV13, are recommended for certain high-risk groups. Your health care provider can give you more information. People 65 years or older should get a dose of PPSV23 even if they have already gotten one or more doses of the vaccine before they turned 55. 3. Talk with your health care provider Tell your vaccine provider if the person getting the vaccine:  Has had an allergic reaction after a previous dose of PPSV23, or has any severe, life-threatening allergies. In some cases, your health care provider may decide to postpone PPSV23 vaccination to a  future visit. People with minor illnesses, such as a cold, may be vaccinated. People who are moderately or severely ill should usually wait until they recover before getting PPSV23. Your health care provider can give you more information. 4. Risks of a vaccine reaction  Redness or pain where the shot is given, feeling tired, fever, or muscle aches can happen after PPSV23. People sometimes faint after medical procedures, including vaccination. Tell your provider if you feel dizzy or have vision changes or ringing in the ears. As with any medicine, there is a very remote chance of a vaccine causing a severe allergic reaction, other serious injury, or death. 5. What if there is a serious problem? An allergic reaction could occur after the vaccinated person leaves the clinic. If you see signs of a severe allergic reaction (hives, swelling of the face and throat, difficulty breathing, a fast heartbeat, dizziness, or weakness), call 9-1-1 and get the person to the nearest hospital. For other signs that concern you, call your health care provider. Adverse reactions should be reported to the Vaccine Adverse Event Reporting System (VAERS). Your health care provider will usually file this report, or you can do it yourself. Visit the VAERS website at www.vaers.SamedayNews.es or call 812-321-8134. VAERS is only for reporting reactions, and VAERS staff do not give medical advice. 6. How can I learn more?  Ask your health care provider.  Call your local or state health department.  Contact the Centers for Disease Control and Prevention (CDC): ? Call 364-426-2060 (1-800-CDC-INFO) or ? Visit CDC's website at http://hunter.com/ CDC Vaccine Information Statement PPSV23 Vaccine (10/17/2018) This information is not intended to replace advice given to you by your health care provider. Make sure you discuss any questions you have with your health care provider. Document Revised: 03/26/2019 Document Reviewed:  07/17/2018 Elsevier Patient Education  [place].

## 2020-02-21 ENCOUNTER — Telehealth: Payer: Self-pay | Admitting: Family Medicine

## 2020-02-21 NOTE — Telephone Encounter (Signed)
He doesn't have another appointment until next year- please have him come and get his labs done.

## 2020-02-21 NOTE — Telephone Encounter (Signed)
Spoke with patient, he states that he had the blood work done here in the office on 01/20/20. I asked him if he wanted to come have them redrawn and he said that he thinks that he is fine and he will do it at his next visit.

## 2020-02-21 NOTE — Telephone Encounter (Unsigned)
Copied from [place] [id number]. Topic: General - Other >> Feb 21, 2020  1:49 PM Keene Breath wrote: Reason for CRM: Patient calling to check his labs from his previous physical.  He stated that it is not showing on his My Chart account.  Please call to discuss at [phone] >> Feb 21, 2020  4:27 PM Keene Breath wrote: Patient is returning a call to [facility] regarding his blood results.  CB# [phone]

## 2020-02-21 NOTE — Telephone Encounter (Signed)
Copied from [place] [id number]. Topic: General - Other >> Feb 21, 2020  1:49 PM Keene Breath wrote: Reason for CRM: Patient calling to check his labs from his previous physical.  He stated that it is not showing on his My Chart account.  Please call to discuss at [phone]

## 2020-02-21 NOTE — Telephone Encounter (Signed)
Spoke with [facility], they are checking to see they have lab results.

## 2020-02-21 NOTE — Telephone Encounter (Signed)
[facility] did not find any blood work in their system.  Called and left a message asking patient to return my call to see when and where he had the blood work done at.

## 2020-02-24 ENCOUNTER — Other Ambulatory Visit: Payer: 59

## 2020-02-24 ENCOUNTER — Other Ambulatory Visit: Payer: Self-pay

## 2020-02-24 LAB — UA/M W/RFLX CULTURE, ROUTINE
Bilirubin, UA: NEGATIVE
Glucose, UA: NEGATIVE
Ketones, UA: NEGATIVE
Leukocytes,UA: NEGATIVE
Nitrite, UA: NEGATIVE
Protein,UA: NEGATIVE
Specific Gravity, UA: 1.02 (ref 1.005–1.030)
Urobilinogen, Ur: 0.2 mg/dL (ref 0.2–1.0)
pH, UA: 7 (ref 5.0–7.5)

## 2020-02-24 LAB — MICROSCOPIC EXAMINATION
Bacteria, UA: NONE SEEN
WBC, UA: NONE SEEN /hpf (ref 0–5)

## 2020-02-24 NOTE — Telephone Encounter (Signed)
Called and spoke with patient. Message relayed. Scheduled labs for today 02/24/20 at 3:30 pm. Earl Baker

## 2020-02-25 LAB — COMPREHENSIVE METABOLIC PANEL
ALT: 40 IU/L (ref 0–44)
AST: 30 IU/L (ref 0–40)
Albumin/Globulin Ratio: 1.7 (ref 1.2–2.2)
Albumin: 4.5 g/dL (ref 3.8–4.9)
Alkaline Phosphatase: 102 IU/L (ref 39–117)
BUN/Creatinine Ratio: 12 (ref 9–20)
BUN: 13 mg/dL (ref 6–24)
Bilirubin Total: 1 mg/dL (ref 0.0–1.2)
CO2: 22 mmol/L (ref 20–29)
Calcium: 9.9 mg/dL (ref 8.7–10.2)
Chloride: 101 mmol/L (ref 96–106)
Creatinine, Ser: 1.11 mg/dL (ref 0.76–1.27)
GFR calc Af Amer: 88 mL/min/{1.73_m2} (ref >59)
GFR calc non Af Amer: 76 mL/min/{1.73_m2} (ref >59)
Globulin, Total: 2.7 g/dL (ref 1.5–4.5)
Glucose: 98 mg/dL (ref 65–99)
Potassium: 4.4 mmol/L (ref 3.5–5.2)
Sodium: 137 mmol/L (ref 134–144)
Total Protein: 7.2 g/dL (ref 6.0–8.5)

## 2020-02-25 LAB — LIPID PANEL W/O CHOL/HDL RATIO
Cholesterol, Total: 203 mg/dL — ABNORMAL HIGH (ref 100–199)
HDL: 36 mg/dL — ABNORMAL LOW (ref >39)
LDL Chol Calc (NIH): 133 mg/dL — ABNORMAL HIGH (ref 0–99)
Triglycerides: 187 mg/dL — ABNORMAL HIGH (ref 0–149)
VLDL Cholesterol Cal: 34 mg/dL (ref 5–40)

## 2020-02-25 LAB — CBC WITH DIFFERENTIAL/PLATELET
Basophils Absolute: 0.1 10*3/uL (ref 0.0–0.2)
Basos: 1 %
EOS (ABSOLUTE): 0.2 10*3/uL (ref 0.0–0.4)
Eos: 2 %
Hematocrit: 47.7 % (ref 37.5–51.0)
Hemoglobin: 15.9 g/dL (ref 13.0–17.7)
Immature Grans (Abs): 0 10*3/uL (ref 0.0–0.1)
Immature Granulocytes: 0 %
Lymphocytes Absolute: 2.6 10*3/uL (ref 0.7–3.1)
Lymphs: 26 %
MCH: 28.8 pg (ref 26.6–33.0)
MCHC: 33.3 g/dL (ref 31.5–35.7)
MCV: 86 fL (ref 79–97)
Monocytes Absolute: 0.8 10*3/uL (ref 0.1–0.9)
Monocytes: 8 %
Neutrophils Absolute: 6.3 10*3/uL (ref 1.4–7.0)
Neutrophils: 63 %
Platelets: 230 10*3/uL (ref 150–450)
RBC: 5.53 x10E6/uL (ref 4.14–5.80)
RDW: 13 % (ref 11.6–15.4)
WBC: 9.9 10*3/uL (ref 3.4–10.8)

## 2020-02-25 LAB — PSA: Prostate Specific Ag, Serum: 0.8 ng/mL (ref 0.0–4.0)

## 2020-02-25 LAB — TSH: TSH: 0.974 u[IU]/mL (ref 0.450–4.500)

## 2020-03-23 ENCOUNTER — Other Ambulatory Visit: Payer: Self-pay

## 2020-03-23 ENCOUNTER — Other Ambulatory Visit: Payer: Self-pay | Admitting: Family Medicine

## 2020-03-23 DIAGNOSIS — E039 Hypothyroidism, unspecified: Secondary | ICD-10-CM

## 2020-03-23 MED ORDER — LEVOTHYROXINE SODIUM 75 MCG PO TABS: 75.0000 ug | ORAL_TABLET | Freq: Every day | ORAL | 3 refills | Status: DC

## 2020-08-26 ENCOUNTER — Telehealth: Payer: Self-pay | Admitting: Family Medicine

## 2020-08-26 NOTE — Telephone Encounter (Signed)
Called pt to see if he has a smart phone do to a mychart visit tomorrow, no answer, left vm to call back

## 2020-08-27 ENCOUNTER — Telehealth: Payer: 59 | Admitting: Family Medicine

## 2020-12-30 ENCOUNTER — Other Ambulatory Visit: Payer: BC Managed Care – PPO

## 2021-01-11 ENCOUNTER — Ambulatory Visit (INDEPENDENT_AMBULATORY_CARE_PROVIDER_SITE_OTHER): Payer: 59 | Admitting: Internal Medicine

## 2021-01-11 VITALS — BP 110/77 | HR 83 | Temp 99.0°F | Resp 16 | Ht 66.0 in | Wt 171.0 lb

## 2021-01-11 DIAGNOSIS — G4733 Obstructive sleep apnea (adult) (pediatric): Secondary | ICD-10-CM | POA: Insufficient documentation

## 2021-01-11 DIAGNOSIS — Z7189 Other specified counseling: Secondary | ICD-10-CM | POA: Diagnosis not present

## 2021-01-11 DIAGNOSIS — Z9989 Dependence on other enabling machines and devices: Secondary | ICD-10-CM

## 2021-01-11 NOTE — Patient Instructions (Signed)

## 2021-01-11 NOTE — Progress Notes (Signed)
[name] [place], [place] [address]  Pulmonary Sleep Medicine   Office Visit Note  Patient Name: Earl Baker DOB: 10-23-1966 MRN [id number]    Chief Complaint: Obstructive Sleep Apnea visit  Brief History:  Earl Baker is seen today for follow up The patient has a 1.5 year history of sleep apnea. Patient is not using PAP nightly. He is waking at night with his mask off. He finds it annoying and misses almost half of the nights. He has lost 15 lbs since he was first diagnosed with sleep apnea.  He has not been told that he snores without the CPAP. He no longer drinks alcohol. He is falling asleep on the couch many nights. The patient feels no difference after sleeping with PAP.  Tthe Epworth Sleepiness Score is 9 out of 24. The patient occasionally take naps. The patient complains of the following: mask leak  The compliance download shows poor compliance with an average use time of 3.7 hours. The AHI is 3.5  The patient does not complain of limb movements disrupting sleep.  ROS  General: (-) fever, (-) chills, (-) night sweat Nose and Sinuses: (-) nasal stuffiness or itchiness, (-) postnasal drip, (-) nosebleeds, (-) sinus trouble. Mouth and Throat: (-) sore throat, (-) hoarseness. Neck: (-) swollen glands, (-) enlarged thyroid, (-) neck pain.   Current Medication: Outpatient Encounter Medications as of 01/11/2021  Medication Sig  . ASPIRIN 81 PO Take by mouth.  . disulfiram (ANTABUSE) 250 MG tablet Take 1 tablet (250 mg total) by mouth daily.  Marland Kitchen levothyroxine (SYNTHROID) 75 MCG tablet Take 1 tablet (75 mcg total) by mouth daily before breakfast.  . Multiple Vitamins-Minerals (MULTIVITAMIN ADULT PO) Take by mouth.  . Omega-3 Fatty Acids (FISH OIL PO) Take by mouth.   No facility-administered encounter medications on file as of 01/11/2021.    Surgical History: Past Surgical History:  Procedure Laterality Date  . APPENDECTOMY    . COLONOSCOPY  WITH PROPOFOL N/A 01/03/2019   Procedure: COLONOSCOPY WITH PROPOFOL;  Surgeon: Lucilla Lame, MD;  Location: [place];  Service: Endoscopy;  Laterality: N/A;  . POLYPECTOMY  01/03/2019   Procedure: POLYPECTOMY;  Surgeon: Lucilla Lame, MD;  Location: [place];  Service: Endoscopy;;    Medical History: Past Medical History:  Diagnosis Date  . Alcoholism /alcohol abuse 09/22/2015  . Arthritis   . Hep C w/o coma, chronic (HCC)    Treated.  Pre 2010.  Marland Kitchen Hyperlipidemia   . Hypothyroidism   . Low back pain   . Olecranon bursitis 07/24/2019  . Substance abuse ([place])     Family History: Non contributory to the present illness  Social History: Social History   Socioeconomic History  . Marital status: Single    Spouse name: Not on file  . Number of children: Not on file  . Years of education: Not on file  . Highest education level: Not on file  Occupational History  . Not on file  Tobacco Use  . Smoking status: Current Every Day Smoker    Packs/day: 0.25    Years: 30.00    Pack years: 7.50    Types: Cigarettes, E-cigarettes  . Smokeless tobacco: Never Used  Vaping Use  . Vaping Use: Some days  . Substances: Nicotine, Flavoring  Substance and Sexual Activity  . Alcohol use: Not Currently    Alcohol/week: 36.0 standard drinks    Types: 36 Cans of beer per week    Comment: weekends -  Pt reports none in over 1 week  . Drug use: Yes    Types: Cocaine    Comment: pt reports none in over 1 month  . Sexual activity: Not on file  Other Topics Concern  . Not on file  Social History Narrative  . Not on file   Social Determinants of Health   Financial Resource Strain: Not on file  Food Insecurity: Not on file  Transportation Needs: Not on file  Physical Activity: Not on file  Stress: Not on file  Social Connections: Not on file  Intimate Partner Violence: Not on file    Vital Signs: Blood pressure 110/77, pulse 83, temperature 99 F (37.2 C), temperature  source Temporal, resp. rate 16, height 5\' 6"  (1.676 m), weight 171 lb (77.6 kg), SpO2 99 %.  Examination: General Appearance: The patient is well-developed, well-nourished, and in no distress. Neck Circumference: 40  Skin: Gross inspection of skin unremarkable. Head: normocephalic, no gross deformities. Eyes: no gross deformities noted. ENT: ears appear grossly normal Neurologic: Alert and oriented. No involuntary movements.    EPWORTH SLEEPINESS SCALE:  Scale:  (0)= no chance of dozing; (1)= slight chance of dozing; (2)= moderate chance of dozing; (3)= high chance of dozing    Chance  Situtation    Sitting and reading: 2    Watching TV: 2    Sitting Inactive in public: 1    As a passenger in car: 1      Lying down to rest: 2    Sitting and talking: 0    Sitting quielty after lunch: 1    In a car, stopped in traffic: 0   TOTAL SCORE:   9 out of 24    SLEEP STUDIES:  1. PSG 07/12/19 AHI 9 SpO3min 83%   CPAP COMPLIANCE DATA:  Date Range: 07/12/20-01/07/21  Average Daily Use: 3.7 hours  Median Use: 3.7  Compliance for > 4 Hours: 19%  AHI: 3.5 respiratory events per hour  Days Used: 94/180  Mask Leak: 14.1  95th Percentile Pressure: 10.2         LABS: No results found for this or any previous visit (from the past 2160 hour(s)).  Radiology: CT ABDOMEN PELVIS W WO CONTRAST  Result Date: 10/15/2019 CLINICAL DATA:  Follow-up abnormal ultrasound. History of appendectomy and liver biopsy 15 years ago. EXAM: CT ABDOMEN AND PELVIS WITHOUT AND WITH CONTRAST TECHNIQUE: Multidetector CT imaging of the abdomen and pelvis was performed following the standard protocol before and following the bolus administration of intravenous contrast. CONTRAST:  133mL OMNIPAQUE IOHEXOL 300 MG/ML  SOLN COMPARISON:  Ultrasound from 09/16/2019 FINDINGS: Lower chest: No acute abnormality. Hepatobiliary: Hypertrophy of the caudate lobe and lateral segment of left lobe of liver  identified. Normal anatomic variant with enlargement of the medial papillary process of the caudate lobe is identified likely accounting for the ultrasound abnormality. This follows the same attenuation and enhancement noted in the remainder of the liver and is favored to represent anatomic variant of the caudate lobe. Pancreas: Unremarkable. No pancreatic ductal dilatation or surrounding inflammatory changes. Spleen: Normal in size without focal abnormality. Adrenals/Urinary Tract: Normal adrenal glands. There is no kidney mass or hydronephrosis. Urinary bladder is unremarkable. Stomach/Bowel: Stomach is within normal limits. Appendix is not visualized. No evidence of bowel wall thickening, distention, or inflammatory changes. Vascular/Lymphatic: No significant vascular findings are present. No enlarged abdominal or pelvic lymph nodes. Reproductive: Prostate gland enlargement Other: No free fluid or fluid collections fat containing umbilical hernia.  Musculoskeletal: Lumbar spondylosis noted. IMPRESSION: 1. No acute findings within the abdomen or pelvis. 2. Normal anatomic variant with enlargement of the medial papillary process of the caudate lobe of liver likely accounting for the ultrasound abnormality. 3. Prostate gland enlargement. Electronically Signed   By: Kerby Moors M.D.   On: 10/15/2019 16:16    No results found.  No results found.    Assessment and Plan: Patient Active Problem List   Diagnosis Date Noted  . OSA on CPAP 01/11/2021  . Hyperlipidemia   . CPAP use counseling   . Rectal polyp   . Alcohol abuse 09/22/2015  . Substance abuse ([place]) 09/22/2015  . Hypothyroidism       The patient is having difficulty tolerating PAP and reports no benefit from PAP use. The patient has lost 15 lbs and plans on losing more. Treatment options were discussed and a repeat study was recommended before he looks into alternates. He wants to lose 20 more lbs and then have a repeat study. The  compliance is poor. The apnea is controlled..   1. OSA- keep trying to use CPAP and PSG will be ordered once he loses weight. 2. CPAP use counseling- Pt reports good compliance with CPAP therapy. Cleaning machine by hand, and changing filters and tubing as directed. Denies headaches, sinus issues, palpitations, or hemoptysis.   3. Overweight- he will continue working on going to the gym and losing weight. He will return in 6 months and a repeat sleep study will be considered.   General Counseling: I have discussed the findings of the evaluation and examination with [facility].  I have also discussed any further diagnostic evaluation thatmay be needed or ordered today. Foxx verbalizes understanding of the findings of todays visit. We also reviewed his medications today and discussed drug interactions and side effects including but not limited excessive drowsiness and altered mental states. We also discussed that there is always a risk not just to him but also people around him. he has been encouraged to call the office with any questions or concerns that should arise related to todays visit.  No orders of the defined types were placed in this encounter.       I have personally obtained a history, examined the patient, evaluated laboratory and imaging results, formulated the assessment and plan and placed orders.  This patient was seen today by Tressie Ellis, PA-C in collaboration with Dr. Devona Konig.    Richelle Ito Saunders Glance, PhD, FAASM  Diplomate, American Board of Sleep Medicine    Allyne Gee, MD [facility] Diplomate ABMS Pulmonary and Critical Care Medicine Sleep medicine

## 2021-01-22 ENCOUNTER — Other Ambulatory Visit: Payer: Self-pay

## 2021-01-22 ENCOUNTER — Ambulatory Visit (INDEPENDENT_AMBULATORY_CARE_PROVIDER_SITE_OTHER): Payer: 59 | Admitting: Family Medicine

## 2021-01-22 ENCOUNTER — Encounter: Payer: Self-pay | Admitting: Family Medicine

## 2021-01-22 VITALS — BP 117/77 | HR 79 | Temp 98.9°F | Ht 65.5 in | Wt 169.0 lb

## 2021-01-22 DIAGNOSIS — R972 Elevated prostate specific antigen [PSA]: Secondary | ICD-10-CM

## 2021-01-22 DIAGNOSIS — E039 Hypothyroidism, unspecified: Secondary | ICD-10-CM | POA: Diagnosis not present

## 2021-01-22 DIAGNOSIS — E785 Hyperlipidemia, unspecified: Secondary | ICD-10-CM

## 2021-01-22 DIAGNOSIS — Z0000 Encounter for general adult medical examination without abnormal findings: Secondary | ICD-10-CM

## 2021-01-22 DIAGNOSIS — L308 Other specified dermatitis: Secondary | ICD-10-CM | POA: Diagnosis not present

## 2021-01-22 DIAGNOSIS — M6740 Ganglion, unspecified site: Secondary | ICD-10-CM | POA: Diagnosis not present

## 2021-01-22 DIAGNOSIS — F1910 Other psychoactive substance abuse, uncomplicated: Secondary | ICD-10-CM

## 2021-01-22 LAB — URINALYSIS, ROUTINE W REFLEX MICROSCOPIC
Bilirubin, UA: NEGATIVE
Glucose, UA: NEGATIVE
Ketones, UA: NEGATIVE
Nitrite, UA: NEGATIVE
Protein,UA: NEGATIVE
RBC, UA: NEGATIVE
Specific Gravity, UA: 1.01 (ref 1.005–1.030)
Urobilinogen, Ur: 0.2 mg/dL (ref 0.2–1.0)
pH, UA: 7 (ref 5.0–7.5)

## 2021-01-22 LAB — MICROSCOPIC EXAMINATION
Bacteria, UA: NONE SEEN
Cast Type: NONE SEEN
Casts: NONE SEEN /lpf
Crystal Type: NONE SEEN
Crystals: NONE SEEN
Mucus, UA: NONE SEEN
RBC, Urine: NONE SEEN /hpf (ref 0–2)
Renal Epithel, UA: NONE SEEN /hpf
Trichomonas, UA: NONE SEEN
Yeast, UA: NONE SEEN

## 2021-01-22 MED ORDER — TRIAMCINOLONE ACETONIDE 0.5 % EX OINT
1.0000 | TOPICAL_OINTMENT | Freq: Two times a day (BID) | CUTANEOUS | 0 refills | Status: DC
Start: 2021-01-22 — End: 2021-12-21

## 2021-01-22 MED ORDER — TRIAMCINOLONE ACETONIDE 0.5 % EX OINT: 1.0000 "application " | TOPICAL_OINTMENT | Freq: Two times a day (BID) | CUTANEOUS | 0 refills | Status: DC

## 2021-01-22 NOTE — Patient Instructions (Signed)

## 2021-01-22 NOTE — Assessment & Plan Note (Signed)
In sustained recovery. Continue to monitor. Call with any concerns.

## 2021-01-22 NOTE — Progress Notes (Signed)
BP 117/77   Pulse 79   Temp 98.9 F (37.2 C)   Ht 5' 5.5" (1.664 m)   Wt 169 lb (76.7 kg)   SpO2 98%   BMI 27.70 kg/m    Subjective:    Patient ID: Earl Baker, male    DOB: 1966-02-14, 55 y.o.   MRN: [id number]  HPI: Earl Baker is a 55 y.o. male presenting on 01/22/2021 for comprehensive medical examination. Current medical complaints include:  HYPOTHYROIDISM Thyroid control status:controlled Satisfied with current treatment? no Medication side effects: no Medication compliance: excellent compliance Recent dose adjustment:no Fatigue: yes Cold intolerance: no Heat intolerance: no Weight gain: no Weight loss: yes Constipation: no Diarrhea/loose stools: no Palpitations: no Lower extremity edema: no Anxiety/depressed mood: no  Has OSA and is not using his CPAP correctly.   Has had a lump on his R achilles for a few years. Starting to be bothered by his boot. Would like it removed.   Has had a rash on his legs where his boot rubs. Not getting better with lotion.   HYPERLIPIDEMIA Hyperlipidemia status: stable Satisfied with current treatment?  yes Side effects:  Not on anything Past cholesterol meds: none Supplements:  Aspirin:  no The 10-year ASCVD risk score Earl Baker DC Jr., et al., 2013) is: 10.9%   Values used to calculate the score:     Age: 15 years     Sex: Male     Is Non-Hispanic African American: No     Diabetic: No     Tobacco smoker: Yes     Systolic Blood Pressure: 458 mmHg     Is BP treated: No     HDL Cholesterol: 36 mg/dL     Total Cholesterol: 203 mg/dL Chest pain:  no Coronary artery disease:  no  Interim Problems from his last visit: no  Depression Screen done today and results listed below:  Depression screen [facility] 2/9 01/22/2021 01/20/2020 12/03/2018 05/29/2017 05/25/2016  Decreased Interest 0 0 0 1 2  Down, Depressed, Hopeless 0 0 0 1 2  PHQ - 2 Score 0 0 0 2 4  Altered sleeping - - 0 - 2  Tired, decreased energy - - 1 - 3   Change in appetite - - 1 - 2  Feeling bad or failure about yourself  - - 0 - 2  Trouble concentrating - - 0 - 1  Moving slowly or fidgety/restless - - 0 - 1  Suicidal thoughts - - 0 - 1  PHQ-9 Score - - 2 - 16    Past Medical History:  Past Medical History:  Diagnosis Date  . Alcoholism /alcohol abuse 09/22/2015  . Arthritis   . Hep C w/o coma, chronic (HCC)    Treated.  Pre 2010.  Marland Kitchen Hyperlipidemia   . Hypothyroidism   . Low back pain   . Olecranon bursitis 07/24/2019  . Substance abuse [facility])     Surgical History:  Past Surgical History:  Procedure Laterality Date  . APPENDECTOMY    . COLONOSCOPY WITH PROPOFOL N/A 01/03/2019   Procedure: COLONOSCOPY WITH PROPOFOL;  Surgeon: Lucilla Lame, MD;  Location: [place];  Service: Endoscopy;  Laterality: N/A;  . POLYPECTOMY  01/03/2019   Procedure: POLYPECTOMY;  Surgeon: Lucilla Lame, MD;  Location: [place];  Service: Endoscopy;;    Medications:  Current Outpatient Medications on File Prior to Visit  Medication Sig  . ASPIRIN 81 PO Take by mouth.  . disulfiram (ANTABUSE) 250 MG tablet  Take 1 tablet (250 mg total) by mouth daily.  Marland Kitchen levothyroxine (SYNTHROID) 75 MCG tablet Take 1 tablet (75 mcg total) by mouth daily before breakfast.  . Multiple Vitamins-Minerals (MULTIVITAMIN ADULT PO) Take by mouth.  . Omega-3 Fatty Acids (FISH OIL PO) Take by mouth.   No current facility-administered medications on file prior to visit.    Allergies:  No Known Allergies  Social History:  Social History   Socioeconomic History  . Marital status: Single    Spouse name: Not on file  . Number of children: Not on file  . Years of education: Not on file  . Highest education level: Not on file  Occupational History  . Not on file  Tobacco Use  . Smoking status: Current Every Day Smoker    Packs/day: 0.25    Years: 30.00    Pack years: 7.50    Types: Cigarettes, E-cigarettes  . Smokeless tobacco: Never Used  Vaping  Use  . Vaping Use: Every day  . Substances: Nicotine, Flavoring  Substance and Sexual Activity  . Alcohol use: Not Currently    Alcohol/week: 36.0 standard drinks    Types: 36 Cans of beer per week    Comment: pt reports quit a year ago   . Drug use: Not Currently    Types: Cocaine    Comment: pt reports quit   . Sexual activity: Not on file  Other Topics Concern  . Not on file  Social History Narrative  . Not on file   Social Determinants of Health   Financial Resource Strain: Not on file  Food Insecurity: Not on file  Transportation Needs: Not on file  Physical Activity: Not on file  Stress: Not on file  Social Connections: Not on file  Intimate Partner Violence: Not on file   Social History   Tobacco Use  Smoking Status Current Every Day Smoker  . Packs/day: 0.25  . Years: 30.00  . Pack years: 7.50  . Types: Cigarettes, E-cigarettes  Smokeless Tobacco Never Used   Social History   Substance and Sexual Activity  Alcohol Use Not Currently  . Alcohol/week: 36.0 standard drinks  . Types: 36 Cans of beer per week   Comment: pt reports quit a year ago     Family History:  Family History  Problem Relation Age of Onset  . Hyperlipidemia Father   . Cancer Father        prostate  . Alcohol abuse Brother     Past medical history, surgical history, medications, allergies, family history and social history reviewed with patient today and changes made to appropriate areas of the chart.   Review of Systems  Constitutional: Negative.   HENT: Negative.   Eyes: Negative.   Respiratory: Negative.   Cardiovascular: Negative.   Gastrointestinal: Negative.   Genitourinary: Positive for frequency. Negative for dysuria, flank pain, hematuria and urgency.       Spot on his penis that is sore  Musculoskeletal: Negative.   Skin: Positive for rash.  Neurological: Negative.   Endo/Heme/Allergies: Negative.   Psychiatric/Behavioral: Negative.    All other ROS negative  except what is listed above and in the HPI.      Objective:    BP 117/77   Pulse 79   Temp 98.9 F (37.2 C)   Ht 5' 5.5" (1.664 m)   Wt 169 lb (76.7 kg)   SpO2 98%   BMI 27.70 kg/m   Wt Readings from Last 3 Encounters:  01/22/21 169  lb (76.7 kg)  01/11/21 171 lb (77.6 kg)  01/20/20 188 lb 8 oz (85.5 kg)    Physical Exam Vitals and nursing note reviewed.  Constitutional:      General: He is not in acute distress.    Appearance: Normal appearance. He is obese. He is not ill-appearing, toxic-appearing or diaphoretic.  HENT:     Head: Normocephalic and atraumatic.     Right Ear: Tympanic membrane, ear canal and external ear normal. There is no impacted cerumen.     Left Ear: Tympanic membrane, ear canal and external ear normal. There is no impacted cerumen.     Nose: Nose normal. No congestion or rhinorrhea.     Mouth/Throat:     Mouth: Mucous membranes are moist.     Pharynx: Oropharynx is clear. No oropharyngeal exudate or posterior oropharyngeal erythema.  Eyes:     General: No scleral icterus.       Right eye: No discharge.        Left eye: No discharge.     Extraocular Movements: Extraocular movements intact.     Conjunctiva/sclera: Conjunctivae normal.     Pupils: Pupils are equal, round, and reactive to light.  Neck:     Vascular: No carotid bruit.  Cardiovascular:     Rate and Rhythm: Normal rate and regular rhythm.     Pulses: Normal pulses.     Heart sounds: No murmur heard. No friction rub. No gallop.   Pulmonary:     Effort: Pulmonary effort is normal. No respiratory distress.     Breath sounds: Normal breath sounds. No stridor. No wheezing, rhonchi or rales.  Chest:     Chest wall: No tenderness.  Abdominal:     General: Abdomen is flat. Bowel sounds are normal. There is no distension.     Palpations: Abdomen is soft. There is no mass.     Tenderness: There is no abdominal tenderness. There is no right CVA tenderness, left CVA tenderness, guarding or  rebound.     Hernia: No hernia is present.  Genitourinary:    Penis: Normal.      Comments: Slight redness on R side of tip of penis, no rash Musculoskeletal:        General: No swelling, tenderness, deformity or signs of injury.     Cervical back: Normal range of motion and neck supple. No rigidity. No muscular tenderness.     Right lower leg: No edema.     Left lower leg: No edema.     Comments: Ganglion cyst on R achilles  Lymphadenopathy:     Cervical: No cervical adenopathy.  Skin:    General: Skin is warm and dry.     Capillary Refill: Capillary refill takes less than 2 seconds.     Coloration: Skin is not jaundiced or pale.     Findings: Rash (eczematous rash on bilateral dorsal calves) present. No bruising, erythema or lesion.  Neurological:     General: No focal deficit present.     Mental Status: He is alert and oriented to person, place, and time.     Cranial Nerves: No cranial nerve deficit.     Sensory: No sensory deficit.     Motor: No weakness.     Coordination: Coordination normal.     Gait: Gait normal.     Deep Tendon Reflexes: Reflexes normal.  Psychiatric:        Mood and Affect: Mood normal.        Behavior:  Behavior normal.        Thought Content: Thought content normal.        Judgment: Judgment normal.     Results for orders placed or performed in visit on 01/20/20  Microscopic Examination   URINE  Result Value Ref Range   WBC, UA None seen 0 - 5 /hpf   RBC 0-2 0 - 2 /hpf   Epithelial Cells (non renal) 0-10 0 - 10 /hpf   Bacteria, UA None seen None seen/Few  CBC with Differential/Platelet  Result Value Ref Range   WBC 9.9 3.4 - 10.8 x10E3/uL   RBC 5.53 4.14 - 5.80 x10E6/uL   Hemoglobin 15.9 13.0 - 17.7 g/dL   Hematocrit 47.7 37.5 - 51.0 %   MCV 86 79 - 97 fL   MCH 28.8 26.6 - 33.0 pg   MCHC 33.3 31.5 - 35.7 g/dL   RDW 13.0 11.6 - 15.4 %   Platelets 230 150 - 450 x10E3/uL   Neutrophils 63 Not Estab. %   Lymphs 26 Not Estab. %   Monocytes  8 Not Estab. %   Eos 2 Not Estab. %   Basos 1 Not Estab. %   Neutrophils Absolute 6.3 1.4 - 7.0 x10E3/uL   Lymphocytes Absolute 2.6 0.7 - 3.1 x10E3/uL   Monocytes Absolute 0.8 0.1 - 0.9 x10E3/uL   EOS (ABSOLUTE) 0.2 0.0 - 0.4 x10E3/uL   Basophils Absolute 0.1 0.0 - 0.2 x10E3/uL   Immature Granulocytes 0 Not Estab. %   Immature Grans (Abs) 0.0 0.0 - 0.1 x10E3/uL  Comprehensive metabolic panel  Result Value Ref Range   Glucose 98 65 - 99 mg/dL   BUN 13 6 - 24 mg/dL   Creatinine, Ser 1.11 0.76 - 1.27 mg/dL   GFR calc non Af Amer 76 >59 mL/min/1.73   GFR calc Af Amer 88 >59 mL/min/1.73   BUN/Creatinine Ratio 12 9 - 20   Sodium 137 134 - 144 mmol/L   Potassium 4.4 3.5 - 5.2 mmol/L   Chloride 101 96 - 106 mmol/L   CO2 22 20 - 29 mmol/L   Calcium 9.9 8.7 - 10.2 mg/dL   Total Protein 7.2 6.0 - 8.5 g/dL   Albumin 4.5 3.8 - 4.9 g/dL   Globulin, Total 2.7 1.5 - 4.5 g/dL   Albumin/Globulin Ratio 1.7 1.2 - 2.2   Bilirubin Total 1.0 0.0 - 1.2 mg/dL   Alkaline Phosphatase 102 39 - 117 IU/L   AST 30 0 - 40 IU/L   ALT 40 0 - 44 IU/L  Lipid Panel w/o Chol/HDL Ratio  Result Value Ref Range   Cholesterol, Total 203 (H) 100 - 199 mg/dL   Triglycerides 187 (H) 0 - 149 mg/dL   HDL 36 (L) >39 mg/dL   VLDL Cholesterol Cal 34 5 - 40 mg/dL   LDL Chol Calc (NIH) 133 (H) 0 - 99 mg/dL  TSH  Result Value Ref Range   TSH 0.974 0.450 - 4.500 uIU/mL  PSA  Result Value Ref Range   Prostate Specific Ag, Serum 0.8 0.0 - 4.0 ng/mL  UA/M w/rflx Culture, Routine   Specimen: Urine   URINE  Result Value Ref Range   Specific Gravity, UA 1.020 1.005 - 1.030   pH, UA 7.0 5.0 - 7.5   Color, UA Yellow Yellow   Appearance Ur Clear Clear   Leukocytes,UA Negative Negative   Protein,UA Negative Negative/Trace   Glucose, UA Negative Negative   Ketones, UA Negative Negative   RBC, UA Trace (  A) Negative   Bilirubin, UA Negative Negative   Urobilinogen, Ur 0.2 0.2 - 1.0 mg/dL   Nitrite, UA Negative Negative    Microscopic Examination See below:       Assessment & Plan:   Problem List Items Addressed This Visit      Endocrine   Hypothyroidism    Rechecking labs today. Await results. Treat as needed.       Relevant Orders   TSH     Other   Substance abuse ([place])    In sustained recovery. Continue to monitor. Call with any concerns.       Hyperlipidemia    Rechecking labs today. Await results. Treat as needed.        Relevant Orders   Lipid Panel w/o Chol/HDL Ratio    Other Visit Diagnoses    Routine general medical examination at a health care facility    -  Primary   Vaccines up to date/declined. Screening labs checked today. Colonoscopy up to date. Continue diet and exercise. Call with any concerns.    Relevant Orders   Comprehensive metabolic panel   CBC with Differential/Platelet   Lipid Panel w/o Chol/HDL Ratio   PSA   TSH   Urinalysis, Routine w reflex microscopic   Hepatitis C Antibody   Ganglion cyst       Will refer to ortho. Call with any concerns.    Relevant Orders   Ambulatory referral to Orthopedic Surgery   Other eczema       Will treat with triamcinalone. Call with any concerns. Continue to monitor.       LABORATORY TESTING:  Health maintenance labs ordered today as discussed above.   The natural history of prostate cancer and ongoing controversy regarding screening and potential treatment outcomes of prostate cancer has been discussed with the patient. The meaning of a false positive PSA and a false negative PSA has been discussed. He indicates understanding of the limitations of this screening test and wishes to proceed with screening PSA testing.   IMMUNIZATIONS:   - Tdap: Tetanus vaccination status reviewed: last tetanus booster within 10 years. - Influenza: Up to date - COVID: Refused  SCREENING: - Colonoscopy: Up to date  Discussed with patient purpose of the colonoscopy is to detect colon cancer at curable precancerous or early stages    PATIENT COUNSELING:    Sexuality: Discussed sexually transmitted diseases, partner selection, use of condoms, avoidance of unintended pregnancy  and contraceptive alternatives.   Advised to avoid cigarette smoking.  I discussed with the patient that most people either abstain from alcohol or drink within safe limits (<=14/week and <=4 drinks/occasion for males, <=7/weeks and <= 3 drinks/occasion for females) and that the risk for alcohol disorders and other health effects rises proportionally with the number of drinks per week and how often a drinker exceeds daily limits.  Discussed cessation/primary prevention of drug use and availability of treatment for abuse.   Diet: Encouraged to adjust caloric intake to maintain  or achieve ideal body weight, to reduce intake of dietary saturated fat and total fat, to limit sodium intake by avoiding high sodium foods and not adding table salt, and to maintain adequate dietary potassium and calcium preferably from fresh fruits, vegetables, and low-fat dairy products.    stressed the importance of regular exercise  Injury prevention: Discussed safety belts, safety helmets, smoke detector, smoking near bedding or upholstery.   Dental health: Discussed importance of regular tooth brushing, flossing, and dental visits.  Follow up plan: NEXT PREVENTATIVE PHYSICAL DUE IN 1 YEAR. Return in about 6 months (around 07/22/2021).

## 2021-01-22 NOTE — Assessment & Plan Note (Signed)
Rechecking labs today. Await results. Treat as needed.  °

## 2021-01-27 LAB — LIPID PANEL W/O CHOL/HDL RATIO
Cholesterol, Total: 189 mg/dL (ref 100–199)
HDL: 38 mg/dL — ABNORMAL LOW (ref >39)
LDL Chol Calc (NIH): 130 mg/dL — ABNORMAL HIGH (ref 0–99)
Triglycerides: 114 mg/dL (ref 0–149)
VLDL Cholesterol Cal: 21 mg/dL (ref 5–40)

## 2021-01-27 LAB — CBC WITH DIFFERENTIAL/PLATELET
Basophils Absolute: 0.1 10*3/uL (ref 0.0–0.2)
Basos: 1 %
EOS (ABSOLUTE): 0.2 10*3/uL (ref 0.0–0.4)
Eos: 2 %
Hematocrit: 45 % (ref 37.5–51.0)
Hemoglobin: 14.4 g/dL (ref 13.0–17.7)
Immature Grans (Abs): 0 10*3/uL (ref 0.0–0.1)
Immature Granulocytes: 0 %
Lymphocytes Absolute: 3.2 10*3/uL — ABNORMAL HIGH (ref 0.7–3.1)
Lymphs: 36 %
MCH: 28.3 pg (ref 26.6–33.0)
MCHC: 32 g/dL (ref 31.5–35.7)
MCV: 88 fL (ref 79–97)
Monocytes Absolute: 0.7 10*3/uL (ref 0.1–0.9)
Monocytes: 8 %
Neutrophils Absolute: 4.6 10*3/uL (ref 1.4–7.0)
Neutrophils: 53 %
Platelets: 294 10*3/uL (ref 150–450)
RBC: 5.09 x10E6/uL (ref 4.14–5.80)
RDW: 13.1 % (ref 11.6–15.4)
WBC: 8.8 10*3/uL (ref 3.4–10.8)

## 2021-01-27 LAB — PSA: Prostate Specific Ag, Serum: 6.1 ng/mL — ABNORMAL HIGH (ref 0.0–4.0)

## 2021-01-27 LAB — COMPREHENSIVE METABOLIC PANEL
ALT: 20 IU/L (ref 0–44)
AST: 19 IU/L (ref 0–40)
Albumin/Globulin Ratio: 1.9 (ref 1.2–2.2)
Albumin: 4.5 g/dL (ref 3.8–4.9)
Alkaline Phosphatase: 100 IU/L (ref 44–121)
BUN/Creatinine Ratio: 16 (ref 9–20)
BUN: 14 mg/dL (ref 6–24)
Bilirubin Total: 0.7 mg/dL (ref 0.0–1.2)
CO2: 14 mmol/L — ABNORMAL LOW (ref 20–29)
Calcium: 9.7 mg/dL (ref 8.7–10.2)
Chloride: 103 mmol/L (ref 96–106)
Creatinine, Ser: 0.85 mg/dL (ref 0.76–1.27)
GFR calc Af Amer: 115 mL/min/{1.73_m2} (ref >59)
GFR calc non Af Amer: 99 mL/min/{1.73_m2} (ref >59)
Globulin, Total: 2.4 g/dL (ref 1.5–4.5)
Glucose: 94 mg/dL (ref 65–99)
Potassium: 4.3 mmol/L (ref 3.5–5.2)
Sodium: 140 mmol/L (ref 134–144)
Total Protein: 6.9 g/dL (ref 6.0–8.5)

## 2021-01-27 LAB — HEPATITIS C ANTIBODY

## 2021-01-27 LAB — TSH: TSH: 1.19 u[IU]/mL (ref 0.450–4.500)

## 2021-01-27 NOTE — Addendum Note (Signed)
Addended by: Valerie Roys on: 01/27/2021 12:50 PM   Modules accepted: Orders

## 2021-02-07 ENCOUNTER — Other Ambulatory Visit: Payer: Self-pay | Admitting: Family Medicine

## 2021-02-07 DIAGNOSIS — F1020 Alcohol dependence, uncomplicated: Secondary | ICD-10-CM

## 2021-02-07 NOTE — Telephone Encounter (Signed)
Requested medication (s) are due for refill today: Yes  Requested medication (s) are on the active medication list: Yes  Last refill:  01/20/20  Future visit scheduled: Yes  Notes to clinic:  Prescription has expired.    Requested Prescriptions  Pending Prescriptions Disp Refills   disulfiram (ANTABUSE) 250 MG tablet [Pharmacy Med Name: DISULFIRAM 250 MG TABLET] 90 tablet 3    Sig: TAKE 1 TABLET BY MOUTH EVERY DAY      Psychiatry: Drug Dependence Therapy - disulfiram Passed - 02/07/2021  2:23 PM      Passed - ALT in normal range and within 90 days    ALT  Date Value Ref Range Status  01/22/2021 20 0 - 44 IU/L Final   ALT (SGPT) Piccolo, Waived  Date Value Ref Range Status  06/10/2019 34 10 - 47 U/L Final          Passed - AST in normal range and within 90 days    AST  Date Value Ref Range Status  01/22/2021 19 0 - 40 IU/L Final   AST (SGOT) Piccolo, Waived  Date Value Ref Range Status  06/10/2019 28 11 - 38 U/L Final          Passed - Valid encounter within last 12 months    Recent Outpatient Visits           2 weeks ago Routine general medical examination at a health care facility   [facility], [place], DO   1 year ago Routine general medical examination at a health care facility   [place], [place], DO   1 year ago Right flank pain   [place], [place]   1 year ago Olecranon bursitis of left elbow   [place], Henrine Screws T, NP   1 year ago Olecranon bursitis of left elbow   [place], Barbaraann Faster, NP       Future Appointments             In 2 weeks Vigg, Avanti, MD [facility]

## 2021-02-24 ENCOUNTER — Ambulatory Visit
Admission: RE | Admit: 2021-02-24 | Discharge: 2021-02-24 | Disposition: A | Discharge status: Home / Self Care | Payer: 59 | Attending: Internal Medicine | Admitting: Internal Medicine

## 2021-02-24 ENCOUNTER — Other Ambulatory Visit: Payer: Self-pay

## 2021-02-24 ENCOUNTER — Ambulatory Visit
Admission: RE | Admit: 2021-02-24 | Discharge: 2021-02-24 | Disposition: A | Discharge status: Home / Self Care | Payer: 59 | Source: Ambulatory Visit | Attending: Internal Medicine | Admitting: Internal Medicine

## 2021-02-24 ENCOUNTER — Ambulatory Visit (INDEPENDENT_AMBULATORY_CARE_PROVIDER_SITE_OTHER): Payer: 59 | Admitting: Internal Medicine

## 2021-02-24 ENCOUNTER — Encounter: Payer: Self-pay | Admitting: Internal Medicine

## 2021-02-24 ENCOUNTER — Ambulatory Visit: Payer: Self-pay | Admitting: Internal Medicine

## 2021-02-24 VITALS — BP 117/80 | HR 65 | Temp 98.5°F | Ht 63.07 in | Wt 169.4 lb

## 2021-02-24 DIAGNOSIS — R972 Elevated prostate specific antigen [PSA]: Secondary | ICD-10-CM

## 2021-02-24 DIAGNOSIS — F1010 Alcohol abuse, uncomplicated: Secondary | ICD-10-CM

## 2021-02-24 DIAGNOSIS — R109 Unspecified abdominal pain: Secondary | ICD-10-CM

## 2021-02-24 DIAGNOSIS — R8281 Pyuria: Secondary | ICD-10-CM

## 2021-02-24 DIAGNOSIS — S93629A Sprain of tarsometatarsal ligament of unspecified foot, initial encounter: Secondary | ICD-10-CM | POA: Insufficient documentation

## 2021-02-24 LAB — URINALYSIS, ROUTINE W REFLEX MICROSCOPIC
Bilirubin, UA: NEGATIVE
Glucose, UA: NEGATIVE
Ketones, UA: NEGATIVE
Leukocytes,UA: NEGATIVE
Nitrite, UA: NEGATIVE
Protein,UA: NEGATIVE
RBC, UA: NEGATIVE
Specific Gravity, UA: 1.01 (ref 1.005–1.030)
Urobilinogen, Ur: 0.2 mg/dL (ref 0.2–1.0)
pH, UA: 7 (ref 5.0–7.5)

## 2021-02-24 MED ORDER — TAMSULOSIN HCL 0.4 MG PO CAPS: 0.4000 mg | ORAL_CAPSULE | Freq: Every day | ORAL | 3 refills | Status: DC

## 2021-02-24 NOTE — Progress Notes (Signed)
BP 117/80   Pulse 65   Temp 98.5 F (36.9 C) (Oral)   Ht 5' 3.07" (1.602 m)   Wt 169 lb 6.4 oz (76.8 kg)   SpO2 98%   BMI 29.94 kg/m    Subjective:    Patient ID: KAYNAN KLONOWSKI, male    DOB: 04-02-1966, 55 y.o.   MRN: [id number]  HPI: CAELUM FEDERICI is a 55 y.o. male  Urinary Frequency  This is a new problem. The current episode started more than 1 month ago. The problem has been gradually worsening. Associated symptoms include frequency. Pertinent negatives include no chills, discharge, flank pain, hematuria, hesitancy, nausea, sweats, urgency or vomiting. Associated symptoms comments: Nocturia x 2 times at night - max of about 4 at night.  During the day pt says 10 times. . There is no history of catheterization, kidney stones, recurrent UTIs, a single kidney or a urological procedure.  Alcohol Problem Pertinent negatives include no agitation, confusion, delusions, hallucinations, intoxication, loss of consciousness, seizures, self-injury, somnolence, violence or weakness. The current episode started 1 to 4 weeks ago. Pertinent negatives include no nausea or vomiting.    Chief Complaint  Patient presents with  . Lab work    Patient states that he needs to have his PSA levels rechecked.  . Meet new provider    Relevant past medical, surgical, family and social history reviewed and updated as indicated. Interim medical history since our last visit reviewed. Allergies and medications reviewed and updated.  Review of Systems  Constitutional: Negative for chills.  Cardiovascular: Negative for chest pain and leg swelling.  Gastrointestinal: Negative for nausea and vomiting.  Genitourinary: Positive for frequency. Negative for difficulty urinating, dysuria, enuresis, flank pain, genital sores, hematuria, hesitancy and urgency.  Neurological: Negative for seizures, loss of consciousness and weakness.  Psychiatric/Behavioral: Negative for agitation, confusion,  hallucinations and self-injury.    Per HPI unless specifically indicated above     Objective:    BP 117/80   Pulse 65   Temp 98.5 F (36.9 C) (Oral)   Ht 5' 3.07" (1.602 m)   Wt 169 lb 6.4 oz (76.8 kg)   SpO2 98%   BMI 29.94 kg/m   Wt Readings from Last 3 Encounters:  02/24/21 169 lb 6.4 oz (76.8 kg)  01/22/21 169 lb (76.7 kg)  01/11/21 171 lb (77.6 kg)    Physical Exam Vitals and nursing note reviewed.  Constitutional:      General: He is not in acute distress.    Appearance: Normal appearance. He is not ill-appearing or diaphoretic.  HENT:     Head: Normocephalic and atraumatic.     Right Ear: Tympanic membrane and external ear normal. There is no impacted cerumen.     Left Ear: External ear normal.     Nose: No congestion or rhinorrhea.     Mouth/Throat:     Pharynx: No oropharyngeal exudate or posterior oropharyngeal erythema.  Eyes:     Conjunctiva/sclera: Conjunctivae normal.     Pupils: Pupils are equal, round, and reactive to light.  Cardiovascular:     Rate and Rhythm: Normal rate and regular rhythm.     Heart sounds: No murmur heard. No friction rub. No gallop.   Pulmonary:     Effort: No respiratory distress.     Breath sounds: No stridor. No wheezing or rhonchi.  Chest:     Chest wall: No tenderness.  Abdominal:     General: Abdomen is flat. Bowel sounds are  normal. There is no distension.     Palpations: Abdomen is soft. There is no mass.     Tenderness: There is no abdominal tenderness. There is no guarding.  Musculoskeletal:        General: No swelling or deformity.     Cervical back: Normal range of motion and neck supple. No rigidity or tenderness.     Right lower leg: No edema.     Left lower leg: No edema.  Skin:    General: Skin is warm and dry.     Coloration: Skin is not jaundiced.     Findings: No erythema.  Neurological:     Mental Status: He is alert and oriented to person, place, and time. Mental status is at baseline.   Psychiatric:        Mood and Affect: Mood normal.        Behavior: Behavior normal.        Thought Content: Thought content normal.        Judgment: Judgment normal.     Results for orders placed or performed in visit on 01/22/21  Microscopic Examination   Urine  Result Value Ref Range   WBC, UA 0-5 0 - 5 /hpf   RBC None seen 0 - 2 /hpf   Epithelial Cells (non renal) 0-10 0 - 10 /hpf   Renal Epithel, UA None seen None seen /hpf   Casts None seen None seen /lpf   Cast Type None seen N/A   Crystals None seen N/A   Crystal Type None seen N/A   Mucus, UA None seen Not Estab.   Bacteria, UA None seen None seen/Few   Yeast, UA None seen None seen   Trichomonas, UA None seen None seen  Comprehensive metabolic panel  Result Value Ref Range   Glucose 94 65 - 99 mg/dL   BUN 14 6 - 24 mg/dL   Creatinine, Ser 0.85 0.76 - 1.27 mg/dL   GFR calc non Af Amer 99 >59 mL/min/1.73   GFR calc Af Amer 115 >59 mL/min/1.73   BUN/Creatinine Ratio 16 9 - 20   Sodium 140 134 - 144 mmol/L   Potassium 4.3 3.5 - 5.2 mmol/L   Chloride 103 96 - 106 mmol/L   CO2 14 (L) 20 - 29 mmol/L   Calcium 9.7 8.7 - 10.2 mg/dL   Total Protein 6.9 6.0 - 8.5 g/dL   Albumin 4.5 3.8 - 4.9 g/dL   Globulin, Total 2.4 1.5 - 4.5 g/dL   Albumin/Globulin Ratio 1.9 1.2 - 2.2   Bilirubin Total 0.7 0.0 - 1.2 mg/dL   Alkaline Phosphatase 100 44 - 121 IU/L   AST 19 0 - 40 IU/L   ALT 20 0 - 44 IU/L  CBC with Differential/Platelet  Result Value Ref Range   WBC 8.8 3.4 - 10.8 x10E3/uL   RBC 5.09 4.14 - 5.80 x10E6/uL   Hemoglobin 14.4 13.0 - 17.7 g/dL   Hematocrit 45.0 37.5 - 51.0 %   MCV 88 79 - 97 fL   MCH 28.3 26.6 - 33.0 pg   MCHC 32.0 31.5 - 35.7 g/dL   RDW 13.1 11.6 - 15.4 %   Platelets 294 150 - 450 x10E3/uL   Neutrophils 53 Not Estab. %   Lymphs 36 Not Estab. %   Monocytes 8 Not Estab. %   Eos 2 Not Estab. %   Basos 1 Not Estab. %   Neutrophils Absolute 4.6 1.4 - 7.0 x10E3/uL   Lymphocytes Absolute  3.2 (H)  0.7 - 3.1 x10E3/uL   Monocytes Absolute 0.7 0.1 - 0.9 x10E3/uL   EOS (ABSOLUTE) 0.2 0.0 - 0.4 x10E3/uL   Basophils Absolute 0.1 0.0 - 0.2 x10E3/uL   Immature Granulocytes 0 Not Estab. %   Immature Grans (Abs) 0.0 0.0 - 0.1 x10E3/uL  Lipid Panel w/o Chol/HDL Ratio  Result Value Ref Range   Cholesterol, Total 189 100 - 199 mg/dL   Triglycerides 114 0 - 149 mg/dL   HDL 38 (L) >39 mg/dL   VLDL Cholesterol Cal 21 5 - 40 mg/dL   LDL Chol Calc (NIH) 130 (H) 0 - 99 mg/dL  PSA  Result Value Ref Range   Prostate Specific Ag, Serum 6.1 (H) 0.0 - 4.0 ng/mL  TSH  Result Value Ref Range   TSH 1.190 0.450 - 4.500 uIU/mL  Urinalysis, Routine w reflex microscopic  Result Value Ref Range   Specific Gravity, UA 1.010 1.005 - 1.030   pH, UA 7.0 5.0 - 7.5   Color, UA Yellow Yellow   Appearance Ur Clear Clear   Leukocytes,UA Trace (A) Negative   Protein,UA Negative Negative/Trace   Glucose, UA Negative Negative   Ketones, UA Negative Negative   RBC, UA Negative Negative   Bilirubin, UA Negative Negative   Urobilinogen, Ur 0.2 0.2 - 1.0 mg/dL   Nitrite, UA Negative Negative   Microscopic Examination See below:   Hepatitis C antibody  Result Value Ref Range   Hep C Virus Ab CANCELED s/co ratio      Assessment & Plan:  1. 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   2. Alcohol abuse is on disulfiram by last pcp.  Has been taking this consistently x 1 yr now.   3. Right flank pain : will check xrays and UA  ? Sec to nephrolithiasis    Ref. Range 05/25/2016 14:11 05/29/2017 10:00 12/03/2018 14:56 02/24/2020 16:14 01/22/2021 10:45  Prostate Specific Ag, Serum Latest Ref Range: 0.0 - 4.0 ng/mL 0.8 1.1 1.1 0.8 6.1 (H)   Problem List Items Addressed This Visit      Other   Alcohol abuse   Relevant Orders   AMB Referral to [facility]    Other Visit Diagnoses    Elevated PSA    -  Primary   Relevant Orders   PSA   Ambulatory referral to Urology   Right flank pain       Relevant Orders   UA/M w/rflx Culture, Routine (STAT)   DG Abd 2 Views       Follow up plan: Return in about 3 weeks (around 03/17/2021).

## 2021-02-24 NOTE — Addendum Note (Signed)
Addended by: Georgina Peer on: 02/24/2021 03:57 PM   Modules accepted: Orders

## 2021-02-25 ENCOUNTER — Ambulatory Visit: Payer: Self-pay | Admitting: Internal Medicine

## 2021-02-25 LAB — PSA: Prostate Specific Ag, Serum: 1.5 ng/mL (ref 0.0–4.0)

## 2021-02-25 NOTE — Progress Notes (Signed)
Please let pt know this was normal.

## 2021-02-26 ENCOUNTER — Telehealth: Payer: Self-pay

## 2021-02-26 NOTE — Chronic Care Management (AMB) (Signed)
  Care Management   Outreach Note  02/26/2021 Name: Earl Baker MRN: [id number] DOB: Jan 05, 1966  Referred by: Charlynne Cousins, MD Reason for referral : Care Coordination ( Outreach to schedule referral with LCSW /Second  Outreach to schedule referral with LCSW )   A second unsuccessful telephone outreach was attempted today. The patient was referred to the case management team for assistance with care management and care coordination.   Follow Up Plan: A HIPAA compliant phone message was left for the patient providing contact information and requesting a return call.  The care management team will reach out to the patient again over the next 3 days.  If patient returns call to provider office, please advise to call [facility] at [place], [place], [place], [place] [address] Direct Dial: [phone] [email or web address]

## 2021-02-26 NOTE — Chronic Care Management (AMB) (Signed)
  Chronic Care Management   Note  02/26/2021 Name: Earl Baker MRN: [id number] DOB: 1966-12-19  Earl Baker is a 55 y.o. year old male who is a primary care patient of Vigg, Avanti, MD. I reached out to [name] by phone today in response to a referral sent by Mr. Earl Baker's PCP, Earl Cousins, MD     [name] was given information about Chronic Care Management services today including:  1. CCM service includes personalized support from designated clinical staff supervised by his physician, including individualized plan of care and coordination with other care providers 2. 24/7 contact phone numbers for assistance for urgent and routine care needs. 3. Service will only be billed when office clinical staff spend 20 minutes or more in a month to coordinate care. 4. Only one practitioner may furnish and bill the service in a calendar month. 5. The patient may stop CCM services at any time (effective at the end of the month) by phone call to the office staff. 6. The patient will be responsible for cost sharing (co-pay) of up to 20% of the service fee (after annual deductible is met).  Patient agreed to services and verbal consent obtained.   Follow up plan: Telephone appointment with care management team member scheduled for:03/05/2021  Noreene Larsson, [place], [place], [place] [address] Direct Dial: [phone] [email or web address]

## 2021-02-27 LAB — URINE CULTURE

## 2021-03-05 ENCOUNTER — Telehealth: Payer: 59

## 2021-03-05 ENCOUNTER — Telehealth: Payer: Self-pay | Admitting: Licensed Clinical Social Worker

## 2021-03-05 NOTE — Telephone Encounter (Signed)
  Chronic Care Management    Clinical Social Work General Follow Up Note  03/05/2021 Name: PISTOL KESSENICH MRN: [id number] DOB: 1966-10-25  ABDULKADIR EMMANUEL is a 55 y.o. year old male who is a primary care patient of Vigg, Avanti, MD. The CCM team was consulted for assistance with Mental Health Counseling and Resources.   Review of patient status, including review of consultants reports, relevant laboratory and other test results, and collaboration with appropriate care team members and the patient's provider was performed as part of comprehensive patient evaluation and provision of chronic care management services.    LCSW completed CCM outreach attempt today but was unable to reach patient successfully. A HIPPA compliant voice message was left encouraging patient to return call once available. LCSW will ask Scheduling Care Guide to reschedule CCM SW appointment with patient as well.   Outpatient Encounter Medications as of 03/05/2021  Medication Sig  . ASPIRIN 81 PO Take by mouth.  . disulfiram (ANTABUSE) 250 MG tablet TAKE 1 TABLET BY MOUTH EVERY DAY  . levothyroxine (SYNTHROID) 75 MCG tablet Take 1 tablet (75 mcg total) by mouth daily before breakfast.  . Multiple Vitamins-Minerals (MULTIVITAMIN ADULT PO) Take by mouth.  . Omega-3 Fatty Acids (FISH OIL PO) Take by mouth.  . tamsulosin (FLOMAX) 0.4 MG CAPS capsule Take 1 capsule (0.4 mg total) by mouth daily.  Marland Kitchen triamcinolone ointment (KENALOG) 0.5 % Apply 1 application topically 2 (two) times daily.   No facility-administered encounter medications on file as of 03/05/2021.    Follow Up Plan: Owen will reach out to patient to reschedule appointment.   Eula Fried, BSW, MSW, [facility] Care Management [email or web address] Phone: [phone]

## 2021-03-10 ENCOUNTER — Ambulatory Visit (INDEPENDENT_AMBULATORY_CARE_PROVIDER_SITE_OTHER): Payer: 59 | Admitting: Urology

## 2021-03-10 ENCOUNTER — Other Ambulatory Visit: Payer: Self-pay

## 2021-03-10 ENCOUNTER — Encounter: Payer: Self-pay | Admitting: Urology

## 2021-03-10 VITALS — BP 122/79 | HR 71 | Ht 66.0 in | Wt 170.0 lb

## 2021-03-10 DIAGNOSIS — Z87898 Personal history of other specified conditions: Secondary | ICD-10-CM

## 2021-03-10 DIAGNOSIS — R350 Frequency of micturition: Secondary | ICD-10-CM | POA: Diagnosis not present

## 2021-03-10 DIAGNOSIS — R3915 Urgency of urination: Secondary | ICD-10-CM

## 2021-03-10 NOTE — Progress Notes (Signed)
03/10/2021 10:27 AM   Earl Baker 28-Aug-1966 [id number]  Referring provider: Charlynne Cousins, MD [address] [place],  [place] [address]  Chief Complaint  Patient presents with  . Elevated PSA    HPI: Earl Baker is a 55 y.o. male referred for evaluation of an elevated PSA.   Baseline PSA between 0.8-1.1  PSA 01/22/2021 elevated at 6.1  Complains of a lifelong history of urinary frequency and urgency but attributes this to significant fluid intake  Voiding symptoms are not bothersome  Follow-up PCP visit 02/24/2021 included a urology for for evaluation of an elevated PSA and starting tamsulosin  His PSA was repeated at that visit and was back to baseline at 1.5  Has only been taking tamsulosin ~ 1 week     PMH: Past Medical History:  Diagnosis Date  . Alcoholism /alcohol abuse 09/22/2015  . Arthritis   . Hep C w/o coma, chronic (HCC)    Treated.  Pre 2010.  Marland Kitchen Hyperlipidemia   . Hypothyroidism   . Low back pain   . Olecranon bursitis 07/24/2019  . Substance abuse [facility])     Surgical History: Past Surgical History:  Procedure Laterality Date  . APPENDECTOMY    . COLONOSCOPY WITH PROPOFOL N/A 01/03/2019   Procedure: COLONOSCOPY WITH PROPOFOL;  Surgeon: Lucilla Lame, MD;  Location: [facility];  Service: Endoscopy;  Laterality: N/A;  . POLYPECTOMY  01/03/2019   Procedure: POLYPECTOMY;  Surgeon: Lucilla Lame, MD;  Location: [facility];  Service: Endoscopy;;    Home Medications:  Allergies as of 03/10/2021   No Known Allergies     Medication List       Accurate as of March 10, 2021 10:27 AM. If you have any questions, ask your nurse or doctor.        ASPIRIN 81 PO Take by mouth.   disulfiram 250 MG tablet Commonly known as: ANTABUSE TAKE 1 TABLET BY MOUTH EVERY DAY   FISH OIL PO Take by mouth.   levothyroxine 75 MCG tablet Commonly known as: SYNTHROID Take 1 tablet (75 mcg total) by mouth daily before breakfast.   MULTIVITAMIN  ADULT PO Take by mouth.   tamsulosin 0.4 MG Caps capsule Commonly known as: FLOMAX Take 1 capsule (0.4 mg total) by mouth daily.   triamcinolone ointment 0.5 % Commonly known as: KENALOG Apply 1 application topically 2 (two) times daily.       Allergies: No Known Allergies  Family History: Family History  Problem Relation Age of Onset  . Hyperlipidemia Father   . Cancer Father        prostate  . Alcohol abuse Brother     Social History:  reports that he has been smoking cigarettes and e-cigarettes. He has a 7.50 pack-year smoking history. He has never used smokeless tobacco. He reports previous alcohol use of about 36.0 standard drinks of alcohol per week. He reports previous drug use. Drug: Cocaine.   Physical Exam: BP 122/79   Pulse 71   Ht 5\' 6"  (1.676 m)   Wt 170 lb (77.1 kg)   BMI 27.44 kg/m   Constitutional:  Alert and oriented, No acute distress. HEENT: [place] AT, moist mucus membranes.  Trachea midline, no masses. Cardiovascular: No clubbing, cyanosis, or edema. Respiratory: Normal respiratory effort, no increased work of breathing. GU: Prostate 30 g, smooth without nodules Skin: No rashes, bruises or suspicious lesions. Neurologic: Grossly intact, no focal deficits, moving all 4 extremities. Psychiatric: Normal mood and affect.  Assessment & Plan:    1.  Transient PSA elevation  1 month follow-up PSA back to baseline and DRE benign  No further evaluation needed  2.  Urinary frequency  Long history of urinary frequency which has not been bothersome  Just started tamsulosin and would have him take this for at least 1 month  If no significant efficacy noted would discontinue after 30 days  Follow-up prn for any PSA changes or worsening voiding symptoms  3.  Urinary urgency  As above   Abbie Sons, MD  [facility] [address] [place], [place] [address] [phone]

## 2021-03-11 ENCOUNTER — Encounter: Payer: Self-pay | Admitting: Urology

## 2021-03-16 ENCOUNTER — Ambulatory Visit: Payer: 59 | Admitting: Internal Medicine

## 2021-03-25 ENCOUNTER — Telehealth: Payer: Self-pay

## 2021-03-25 ENCOUNTER — Ambulatory Visit: Payer: 59 | Admitting: Internal Medicine

## 2021-03-25 NOTE — Chronic Care Management (AMB) (Signed)
  Care Management   Note  03/25/2021 Name: Earl Baker MRN: [id number] DOB: [birth year]  Earl Baker is a 55 y.o. year old male who is a primary care patient of Vigg, Avanti, MD and is actively engaged with the care management team. I reached out to [name] by phone today to assist with re-scheduling a follow up visit with the Licensed Clinical Social Worker  Follow up plan: Patient declines further follow up and engagement by the care management team. Appropriate care team members and provider have been notified via electronic communication.  The care management team is available to follow up with the patient after provider conversation with the patient regarding recommendation for care management engagement and subsequent re-referral to the care management team.   Noreene Larsson, [place], [place], [place] [address] Direct Dial: [phone] [email or web address]

## 2021-03-25 NOTE — Telephone Encounter (Signed)
Patient declined rescheduling

## 2021-04-07 ENCOUNTER — Encounter: Payer: Self-pay | Admitting: Internal Medicine

## 2021-04-07 ENCOUNTER — Other Ambulatory Visit: Payer: Self-pay

## 2021-04-07 ENCOUNTER — Ambulatory Visit (INDEPENDENT_AMBULATORY_CARE_PROVIDER_SITE_OTHER): Payer: 59 | Admitting: Internal Medicine

## 2021-04-07 VITALS — BP 111/73 | HR 71 | Temp 98.1°F | Ht 66.02 in | Wt 175.4 lb

## 2021-04-07 DIAGNOSIS — F1910 Other psychoactive substance abuse, uncomplicated: Secondary | ICD-10-CM

## 2021-04-07 DIAGNOSIS — E039 Hypothyroidism, unspecified: Secondary | ICD-10-CM | POA: Diagnosis not present

## 2021-04-07 DIAGNOSIS — Z139 Encounter for screening, unspecified: Secondary | ICD-10-CM

## 2021-04-07 DIAGNOSIS — R972 Elevated prostate specific antigen [PSA]: Secondary | ICD-10-CM

## 2021-04-07 DIAGNOSIS — E785 Hyperlipidemia, unspecified: Secondary | ICD-10-CM

## 2021-04-07 NOTE — Progress Notes (Signed)
BP 111/73   Pulse 71   Temp 98.1 F (36.7 C) (Oral)   Ht 5' 6.02" (1.677 m)   Wt 175 lb 6.4 oz (79.6 kg)   SpO2 98%   BMI 28.29 kg/m    Subjective:    Patient ID: Earl Baker, male    DOB: [birth year], 55 y.o.   MRN: [id number]  HPI: Earl Baker is a 55 y.o. male  Urinary Frequency  This is a new problem. The current episode started more than 1 month ago. The problem has been gradually worsening. Associated symptoms include frequency. Pertinent negatives include no chills, discharge, flank pain, hematuria, hesitancy, nausea, sweats, urgency or vomiting. Associated symptoms comments: Nocturia x 2 times at night - max of about 4 at night.  During the day pt says 10 times. . There is no history of catheterization, kidney stones, recurrent UTIs, a single kidney or a urological procedure.  Alcohol Problem Pertinent negatives include no agitation, confusion, delusions, hallucinations, intoxication, loss of consciousness, seizures, self-injury, somnolence, violence or weakness. The current episode started 1 to 4 weeks ago. Pertinent negatives include no nausea or vomiting.    Chief Complaint  Patient presents with  . Elevated PSA    Relevant past medical, surgical, family and social history reviewed and updated as indicated. Interim medical history since our last visit reviewed. Allergies and medications reviewed and updated.  Review of Systems  Constitutional: Negative for chills.  Cardiovascular: Negative for chest pain and leg swelling.  Gastrointestinal: Negative for nausea and vomiting.  Genitourinary: Positive for frequency. Negative for difficulty urinating, dysuria, enuresis, flank pain, genital sores, hematuria, hesitancy and urgency.  Neurological: Negative for seizures, loss of consciousness and weakness.  Psychiatric/Behavioral: Negative for agitation, confusion, hallucinations and self-injury.    Per HPI unless specifically indicated above      Objective:    BP 111/73   Pulse 71   Temp 98.1 F (36.7 C) (Oral)   Ht 5' 6.02" (1.677 m)   Wt 175 lb 6.4 oz (79.6 kg)   SpO2 98%   BMI 28.29 kg/m   Wt Readings from Last 3 Encounters:  04/07/21 175 lb 6.4 oz (79.6 kg)  03/10/21 170 lb (77.1 kg)  02/24/21 169 lb 6.4 oz (76.8 kg)    Physical Exam Vitals and nursing note reviewed.  Constitutional:      General: He is not in acute distress.    Appearance: Normal appearance. He is not ill-appearing or diaphoretic.  HENT:     Head: Normocephalic and atraumatic.     Right Ear: Tympanic membrane and external ear normal. There is no impacted cerumen.     Left Ear: External ear normal.     Nose: No congestion or rhinorrhea.     Mouth/Throat:     Pharynx: No oropharyngeal exudate or posterior oropharyngeal erythema.  Eyes:     Conjunctiva/sclera: Conjunctivae normal.     Pupils: Pupils are equal, round, and reactive to light.  Cardiovascular:     Rate and Rhythm: Normal rate and regular rhythm.     Heart sounds: No murmur heard. No friction rub. No gallop.   Pulmonary:     Effort: No respiratory distress.     Breath sounds: No stridor. No wheezing or rhonchi.  Chest:     Chest wall: No tenderness.  Abdominal:     General: Abdomen is flat. Bowel sounds are normal. There is no distension.     Palpations: Abdomen is soft. There is no mass.  Tenderness: There is no abdominal tenderness. There is no guarding.  Musculoskeletal:        General: No swelling or deformity.     Cervical back: Normal range of motion and neck supple. No rigidity or tenderness.     Right lower leg: No edema.     Left lower leg: No edema.  Skin:    General: Skin is warm and dry.     Coloration: Skin is not jaundiced.     Findings: No erythema.  Neurological:     Mental Status: He is alert and oriented to person, place, and time. Mental status is at baseline.  Psychiatric:        Mood and Affect: Mood normal.        Behavior: Behavior normal.         Thought Content: Thought content normal.        Judgment: Judgment normal.     Results for orders placed or performed in visit on 02/24/21  Urine Culture   Specimen: Urine   UR  Result Value Ref Range   Urine Culture, Routine Final report    Organism ID, Bacteria Lactobacillus species   PSA  Result Value Ref Range   Prostate Specific Ag, Serum 1.5 0.0 - 4.0 ng/mL  Urinalysis, Routine w reflex microscopic  Result Value Ref Range   Specific Gravity, UA 1.010 1.005 - 1.030   pH, UA 7.0 5.0 - 7.5   Color, UA Yellow Yellow   Appearance Ur Clear Clear   Leukocytes,UA Negative Negative   Protein,UA Negative Negative/Trace   Glucose, UA Negative Negative   Ketones, UA Negative Negative   RBC, UA Negative Negative   Bilirubin, UA Negative Negative   Urobilinogen, Ur 0.2 0.2 - 1.0 mg/dL   Nitrite, UA Negative Negative      Assessment & Plan:  1. eleavted PSA followed up with urology for such and per pt he had a rectal prostate check was normal. Continue on Flomax since prostate was enlareged per CT scan 2020 and symptoms better since starting this med.  now down to once at night - 3 earlier on to start.   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   2. Alcohol abuse is on disulfiram by last pcp.  Has been taking this consistently x 1 yr now.    3. Right flank pain : abdominal xrays -ve                    Problem List Items Addressed This Visit   None      Follow up plan: No follow-ups on file.

## 2021-05-05 ENCOUNTER — Ambulatory Visit (INDEPENDENT_AMBULATORY_CARE_PROVIDER_SITE_OTHER): Payer: 59 | Admitting: Internal Medicine

## 2021-05-05 ENCOUNTER — Encounter: Payer: Self-pay | Admitting: Internal Medicine

## 2021-05-05 ENCOUNTER — Other Ambulatory Visit: Payer: Self-pay

## 2021-05-05 VITALS — BP 102/67 | HR 66 | Temp 98.8°F | Ht 66.02 in | Wt 170.0 lb

## 2021-05-05 DIAGNOSIS — B029 Zoster without complications: Secondary | ICD-10-CM | POA: Diagnosis not present

## 2021-05-05 MED ORDER — GABAPENTIN 100 MG PO CAPS: 100.0000 mg | ORAL_CAPSULE | Freq: Every day | ORAL | 1 refills | Status: DC

## 2021-05-05 MED ORDER — METHYLPREDNISOLONE 4 MG PO TBPK: ORAL_TABLET | ORAL | 0 refills | Status: DC

## 2021-05-05 MED ORDER — VALACYCLOVIR HCL 1 G PO TABS: 1000.0000 mg | ORAL_TABLET | Freq: Three times a day (TID) | ORAL | 0 refills | Status: AC

## 2021-05-05 NOTE — Progress Notes (Signed)
Ht 5' 6.02" (1.677 m)   Wt 170 lb (77.1 kg)   BMI 27.42 kg/m    Subjective:    Patient ID: Earl Baker, male    DOB: 12-30-1965, 55 y.o.   MRN: [id number]  HPI: Earl Baker is a 55 y.o. male  Rash This is a new problem. The current episode started in the past 7 days (rt side of the abdominal wall. ).    Chief Complaint  Patient presents with  . Herpes Zoster    Patient believes he has shingles, started itching 1 week ago and 2 days ago started to get welts, is having some shooting pain at times but not all the time.    Relevant past medical, surgical, family and social history reviewed and updated as indicated. Interim medical history since our last visit reviewed. Allergies and medications reviewed and updated.  Review of Systems  Skin: Positive for rash.    Per HPI unless specifically indicated above     Objective:    Ht 5' 6.02" (1.677 m)   Wt 170 lb (77.1 kg)   BMI 27.42 kg/m   Wt Readings from Last 3 Encounters:  05/05/21 170 lb (77.1 kg)  04/07/21 175 lb 6.4 oz (79.6 kg)  03/10/21 170 lb (77.1 kg)    Physical Exam Constitutional:      Appearance: Normal appearance.  Cardiovascular:     Rate and Rhythm: Regular rhythm.  Musculoskeletal:        General: No swelling.  Skin:    Findings: Lesion and rash present.     Comments: on rt side of abdominal wall in a dermatomal distribution   Neurological:     Mental Status: He is alert and oriented to person, place, and time.     Results for orders placed or performed in visit on 02/24/21  Urine Culture   Specimen: Urine   UR  Result Value Ref Range   Urine Culture, Routine Final report    Organism ID, Bacteria Lactobacillus species   PSA  Result Value Ref Range   Prostate Specific Ag, Serum 1.5 0.0 - 4.0 ng/mL  Urinalysis, Routine w reflex microscopic  Result Value Ref Range   Specific Gravity, UA 1.010 1.005 - 1.030   pH, UA 7.0 5.0 - 7.5   Color, UA Yellow Yellow   Appearance Ur  Clear Clear   Leukocytes,UA Negative Negative   Protein,UA Negative Negative/Trace   Glucose, UA Negative Negative   Ketones, UA Negative Negative   RBC, UA Negative Negative   Bilirubin, UA Negative Negative   Urobilinogen, Ur 0.2 0.2 - 1.0 mg/dL   Nitrite, UA Negative Negative        Current Outpatient Medications:  .  ASPIRIN 81 PO, Take by mouth., Disp: , Rfl:  .  disulfiram (ANTABUSE) 250 MG tablet, TAKE 1 TABLET BY MOUTH EVERY DAY, Disp: 90 tablet, Rfl: 3 .  levothyroxine (SYNTHROID) 75 MCG tablet, Take 1 tablet (75 mcg total) by mouth daily before breakfast., Disp: 90 tablet, Rfl: 3 .  Multiple Vitamins-Minerals (MULTIVITAMIN ADULT PO), Take by mouth., Disp: , Rfl:  .  Omega-3 Fatty Acids (FISH OIL PO), Take by mouth., Disp: , Rfl:  .  tamsulosin (FLOMAX) 0.4 MG CAPS capsule, Take 1 capsule (0.4 mg total) by mouth daily., Disp: 30 capsule, Rfl: 3 .  triamcinolone ointment (KENALOG) 0.5 %, Apply 1 application topically 2 (two) times daily., Disp: 30 g, Rfl: 0    Assessment & Plan:  Shingles :  will start pt on valtrex 1000 mg tid x 7 days.  Check a1c Use medrol dose pak to reduce inflammation Fu with me in 1 week  Problem List Items Addressed This Visit   None      Follow up plan: No follow-ups on file.

## 2021-05-06 ENCOUNTER — Other Ambulatory Visit: Payer: Self-pay | Admitting: Family Medicine

## 2021-05-06 DIAGNOSIS — E039 Hypothyroidism, unspecified: Secondary | ICD-10-CM

## 2021-05-06 LAB — BAYER DCA HB A1C WAIVED: HB A1C (BAYER DCA - WAIVED): 5.5 % (ref <7.0)

## 2021-05-14 ENCOUNTER — Encounter: Payer: Self-pay | Admitting: Internal Medicine

## 2021-05-14 ENCOUNTER — Ambulatory Visit (INDEPENDENT_AMBULATORY_CARE_PROVIDER_SITE_OTHER): Payer: 59 | Admitting: Internal Medicine

## 2021-05-14 ENCOUNTER — Other Ambulatory Visit: Payer: Self-pay

## 2021-05-14 VITALS — BP 112/78 | HR 72 | Temp 98.0°F | Ht 66.02 in | Wt 171.0 lb

## 2021-05-14 DIAGNOSIS — B009 Herpesviral infection, unspecified: Secondary | ICD-10-CM

## 2021-05-14 NOTE — Progress Notes (Signed)
BP 112/78   Pulse 72   Temp 98 F (36.7 C) (Oral)   Ht 5' 6.02" (1.677 m)   Wt 171 lb (77.6 kg)   SpO2 98%   BMI 27.58 kg/m    Subjective:    Patient ID: Earl Baker, male    DOB: 03-Aug-1966, 55 y.o.   MRN: [id number]  HPI: Earl Baker is a 55 y.o. male  Pt is here for a fu on herpes zoster rash noted on the rt side of the abdomen in a dermatomal fashion, almost doen with valtrex and using triamcinolone cream on rash.    Rash This is a new problem. The current episode started in the past 7 days. Pertinent negatives include no anorexia, congestion, cough, diarrhea, eye pain, facial edema, fatigue, fever, joint pain, nail changes, rhinorrhea, shortness of breath or sore throat.    Chief Complaint  Patient presents with  . Herpes Zoster    Patient states that the rash is almost gone and is no painful    Relevant past medical, surgical, family and social history reviewed and updated as indicated. Interim medical history since our last visit reviewed. Allergies and medications reviewed and updated.  Review of Systems  Constitutional: Negative for fatigue and fever.  HENT: Negative for congestion, rhinorrhea and sore throat.   Eyes: Negative for pain.  Respiratory: Negative for cough and shortness of breath.   Gastrointestinal: Negative for anorexia and diarrhea.  Musculoskeletal: Negative for joint pain.  Skin: Positive for rash. Negative for nail changes.    Per HPI unless specifically indicated above     Objective:    BP 112/78   Pulse 72   Temp 98 F (36.7 C) (Oral)   Ht 5' 6.02" (1.677 m)   Wt 171 lb (77.6 kg)   SpO2 98%   BMI 27.58 kg/m   Wt Readings from Last 3 Encounters:  05/14/21 171 lb (77.6 kg)  05/05/21 170 lb (77.1 kg)  04/07/21 175 lb 6.4 oz (79.6 kg)    Physical Exam Vitals and nursing note reviewed.  Constitutional:      General: He is not in acute distress.    Appearance: Normal appearance. He is not ill-appearing or  diaphoretic.  Cardiovascular:     Rate and Rhythm: Normal rate and regular rhythm.  Abdominal:     General: Abdomen is flat. Bowel sounds are normal.     Palpations: Abdomen is soft. There is no mass.     Tenderness: There is no abdominal tenderness.  Musculoskeletal:     Cervical back: Normal range of motion and neck supple. No rigidity or tenderness.  Skin:    General: Skin is warm and dry.     Findings: Rash present.     Comments: Rt sided abdominal rash noted   Neurological:     Mental Status: He is alert.     Results for orders placed or performed in visit on 05/05/21  Bayer DCA Hb A1c Waived  Result Value Ref Range   HB A1C (BAYER DCA - WAIVED) 5.5 <7.0 %        Current Outpatient Medications:  .  ASPIRIN 81 PO, Take by mouth., Disp: , Rfl:  .  disulfiram (ANTABUSE) 250 MG tablet, TAKE 1 TABLET BY MOUTH EVERY DAY, Disp: 90 tablet, Rfl: 3 .  gabapentin (NEURONTIN) 100 MG capsule, Take 1 capsule (100 mg total) by mouth at bedtime., Disp: 30 capsule, Rfl: 1 .  levothyroxine (SYNTHROID) 75 MCG tablet, TAKE  1 TABLET (75 MCG TOTAL) BY MOUTH DAILY BEFORE BREAKFAST., Disp: 90 tablet, Rfl: 3 .  Multiple Vitamins-Minerals (MULTIVITAMIN ADULT PO), Take by mouth., Disp: , Rfl:  .  Omega-3 Fatty Acids (FISH OIL PO), Take by mouth., Disp: , Rfl:  .  tamsulosin (FLOMAX) 0.4 MG CAPS capsule, Take 1 capsule (0.4 mg total) by mouth daily., Disp: 30 capsule, Rfl: 3 .  triamcinolone ointment (KENALOG) 0.5 %, Apply 1 application topically 2 (two) times daily., Disp: 30 g, Rfl: 0    Assessment & Plan:  Hereps zoster:  on the right side of the abdominal wall.  Stable, improving continue triamcinolone.  S/p medrol dose pak. Not diabetic, a1c checked last visit was  5.5  Feels better per pt.   Problem List Items Addressed This Visit   None      Follow up plan: No follow-ups on file.

## 2021-05-24 ENCOUNTER — Other Ambulatory Visit: Payer: Self-pay | Admitting: Internal Medicine

## 2021-07-12 ENCOUNTER — Ambulatory Visit: Payer: 59

## 2021-07-14 ENCOUNTER — Ambulatory Visit (INDEPENDENT_AMBULATORY_CARE_PROVIDER_SITE_OTHER): Payer: 59 | Admitting: Internal Medicine

## 2021-07-14 ENCOUNTER — Other Ambulatory Visit: Payer: Self-pay

## 2021-07-14 ENCOUNTER — Encounter: Payer: Self-pay | Admitting: Internal Medicine

## 2021-07-14 VITALS — BP 115/76 | HR 69 | Temp 97.7°F | Ht 66.14 in | Wt 171.6 lb

## 2021-07-14 DIAGNOSIS — D229 Melanocytic nevi, unspecified: Secondary | ICD-10-CM

## 2021-07-14 DIAGNOSIS — Z0000 Encounter for general adult medical examination without abnormal findings: Secondary | ICD-10-CM

## 2021-07-14 NOTE — Progress Notes (Signed)
BP 115/76   Pulse 69   Temp 97.7 F (36.5 C) (Oral)   Ht 5' 6.14" (1.68 m)   Wt 171 lb 9.6 oz (77.8 kg)   SpO2 98%   BMI 27.58 kg/m    Subjective:    Patient ID: Earl Baker, male    DOB: January 01, 1966, 55 y.o.   MRN: [id number]  Chief Complaint  Patient presents with  . lesion on arm    Right fore arm, was flat but now is raised. Noticed 3 months ago    HPI: Earl Baker is a 55 y.o. male  Pt is here for a mole change has had this for a while but has noticed a change in color and shape as well as size.    Chief Complaint  Patient presents with  . lesion on arm    Right fore arm, was flat but now is raised. Noticed 3 months ago    Relevant past medical, surgical, family and social history reviewed and updated as indicated. Interim medical history since our last visit reviewed. Allergies and medications reviewed and updated.  Review of Systems  Per HPI unless specifically indicated above     Objective:    BP 115/76   Pulse 69   Temp 97.7 F (36.5 C) (Oral)   Ht 5' 6.14" (1.68 m)   Wt 171 lb 9.6 oz (77.8 kg)   SpO2 98%   BMI 27.58 kg/m   Wt Readings from Last 3 Encounters:  07/14/21 171 lb 9.6 oz (77.8 kg)  05/14/21 171 lb (77.6 kg)  05/05/21 170 lb (77.1 kg)    Physical Exam Vitals and nursing note reviewed.  Constitutional:      General: He is not in acute distress.    Appearance: Normal appearance. He is not ill-appearing or diaphoretic.  HENT:     Nose: No congestion or rhinorrhea.     Mouth/Throat:     Pharynx: No oropharyngeal exudate or posterior oropharyngeal erythema.  Abdominal:     Tenderness: There is abdominal tenderness.  Musculoskeletal:     Cervical back: Normal range of motion and neck supple. No rigidity or tenderness.     Left lower leg: No edema.  Skin:    General: Skin is warm.     Coloration: Skin is not jaundiced.     Findings: Lesion present. No bruising or rash.     Comments: 1 raised mole with black  discoloration noted on the dorsal surface of the right forearm  Neurological:     Mental Status: He is alert.    Results for orders placed or performed in visit on 05/05/21  Bayer DCA Hb A1c Waived  Result Value Ref Range   HB A1C (BAYER DCA - WAIVED) 5.5 <7.0 %        Current Outpatient Medications:  .  ASPIRIN 81 PO, Take by mouth., Disp: , Rfl:  .  disulfiram (ANTABUSE) 250 MG tablet, TAKE 1 TABLET BY MOUTH EVERY DAY, Disp: 90 tablet, Rfl: 3 .  levothyroxine (SYNTHROID) 75 MCG tablet, TAKE 1 TABLET (75 MCG TOTAL) BY MOUTH DAILY BEFORE BREAKFAST., Disp: 90 tablet, Rfl: 3 .  Multiple Vitamins-Minerals (MULTIVITAMIN ADULT PO), Take by mouth., Disp: , Rfl:  .  Omega-3 Fatty Acids (FISH OIL PO), Take by mouth., Disp: , Rfl:  .  tamsulosin (FLOMAX) 0.4 MG CAPS capsule, TAKE 1 CAPSULE BY MOUTH EVERY DAY, Disp: 90 capsule, Rfl: 0 .  triamcinolone ointment (KENALOG) 0.5 %, Apply 1 application  topically 2 (two) times daily., Disp: 30 g, Rfl: 0 .  gabapentin (NEURONTIN) 100 MG capsule, Take 1 capsule (100 mg total) by mouth at bedtime., Disp: 30 capsule, Rfl: 1    Assessment & Plan:  Mole changing in colour on the Right forearm  Will refer to dermatology.   Problem List Items Addressed This Visit   None Visit Diagnoses     Change in mole    -  Primary   Relevant Orders   Ambulatory referral to Dermatology        Orders Placed This Encounter  Procedures  . Ambulatory referral to Dermatology     No orders of the defined types were placed in this encounter.    Follow up plan: No follow-ups on file.

## 2021-07-15 ENCOUNTER — Telehealth: Payer: Self-pay

## 2021-07-15 NOTE — Telephone Encounter (Signed)
Sure please resend

## 2021-07-15 NOTE — Telephone Encounter (Signed)
Copied from [place] [id number]. Topic: Referral - Question >> Jul 15, 2021 10:46 AM Pawlus, Brayton Layman A wrote: Reason for CRM: Pt wanted to know if Dr Neomia Dear could send in a new referral, pt stated if the referral was sent to [facility] in [place] he could be seen a lot sooner, pt stated he could not be seen until next year with the current referral.

## 2021-07-15 NOTE — Telephone Encounter (Signed)
Called patient to discuss lab results, no answer, left a voicemail for patientto return my call.    [place] for nurse triage to give results if patient calls back.

## 2021-07-16 NOTE — Telephone Encounter (Signed)
Thank you :)

## 2021-10-01 ENCOUNTER — Other Ambulatory Visit: Payer: 59

## 2021-10-07 ENCOUNTER — Ambulatory Visit: Payer: 59 | Admitting: Internal Medicine

## 2021-10-08 NOTE — Progress Notes (Signed)
[facility] [place], [place] [address]  Pulmonary Sleep Medicine   Office Visit Note  Patient Name: Earl Baker DOB: 05-May-1966 MRN [id number]    Chief Complaint: Obstructive Sleep Apnea visit  Brief History:  Earl Baker is seen today for 6 month follow up to improve compliance. The patient has a 2 year history of sleep apnea. Patient is not using PAP nightly @ 6-20 cmH2O..  The patient feels better rested after sleeping with PAP.  The patient reports improvement  from PAP use. Reported sleepiness is  improved and the Epworth Sleepiness Score is 7 out of 24. The patient does not take naps. The patient complains of the following: no complaints.  The compliance download shows  compliance with an average use time of 2.0 hours @ 19%. The AHI is 3.8  The patient does not complain of limb movements disrupting sleep.  ROS  General: (-) fever, (-) chills, (-) night sweat Nose and Sinuses: (-) nasal stuffiness or itchiness, (-) postnasal drip, (-) nosebleeds, (-) sinus trouble. Mouth and Throat: (-) sore throat, (-) hoarseness. Neck: (-) swollen glands, (-) enlarged thyroid, (-) neck pain. Respiratory: + cough, - shortness of breath, - wheezing. Neurologic: - numbness, - tingling. Psychiatric: - anxiety, - depression   Current Medication: Outpatient Encounter Medications as of 10/11/2021  Medication Sig   ASPIRIN 81 PO Take by mouth.   disulfiram (ANTABUSE) 250 MG tablet TAKE 1 TABLET BY MOUTH EVERY DAY   levothyroxine (SYNTHROID) 75 MCG tablet TAKE 1 TABLET (75 MCG TOTAL) BY MOUTH DAILY BEFORE BREAKFAST.   Multiple Vitamins-Minerals (MULTIVITAMIN ADULT PO) Take by mouth.   Omega-3 Fatty Acids (FISH OIL PO) Take by mouth.   tamsulosin (FLOMAX) 0.4 MG CAPS capsule TAKE 1 CAPSULE BY MOUTH EVERY DAY   triamcinolone ointment (KENALOG) 0.5 % Apply 1 application topically 2 (two) times daily.   No facility-administered encounter medications on file as of  10/11/2021.    Surgical History: Past Surgical History:  Procedure Laterality Date   APPENDECTOMY     COLONOSCOPY WITH PROPOFOL N/A 01/03/2019   Procedure: COLONOSCOPY WITH PROPOFOL;  Surgeon: Lucilla Lame, MD;  Location: [place];  Service: Endoscopy;  Laterality: N/A;   POLYPECTOMY  01/03/2019   Procedure: POLYPECTOMY;  Surgeon: Lucilla Lame, MD;  Location: [place];  Service: Endoscopy;;    Medical History: Past Medical History:  Diagnosis Date   Alcoholism /alcohol abuse 09/22/2015   Arthritis    Hep C w/o coma, chronic ([place])    Treated.  Pre 2010.   Hyperlipidemia    Hypothyroidism    Low back pain    Olecranon bursitis 07/24/2019   Substance abuse ([place])     Family History: Non contributory to the present illness  Social History: Social History   Socioeconomic History   Marital status: Single    Spouse name: Not on file   Number of children: Not on file   Years of education: Not on file   Highest education level: Not on file  Occupational History   Not on file  Tobacco Use   Smoking status: Every Day    Packs/day: 0.25    Years: 30.00    Pack years: 7.50    Types: Cigarettes, E-cigarettes   Smokeless tobacco: Never  Vaping Use   Vaping Use: Every day   Substances: Nicotine, Flavoring  Substance and Sexual Activity   Alcohol use: Not Currently    Alcohol/week: 36.0 standard drinks    Types: 36  Cans of beer per week    Comment: pt reports quit a year ago    Drug use: Not Currently    Types: Cocaine    Comment: pt reports quit    Sexual activity: Not on file  Other Topics Concern   Not on file  Social History Narrative   Not on file   Social Determinants of Health   Financial Resource Strain: Not on file  Food Insecurity: Not on file  Transportation Needs: Not on file  Physical Activity: Not on file  Stress: Not on file  Social Connections: Not on file  Intimate Partner Violence: Not on file    Vital Signs: There were no  vitals taken for this visit.  Examination: General Appearance: The patient is well-developed, well-nourished, and in no distress. Neck Circumference: 40 cm Skin: Gross inspection of skin unremarkable. Head: normocephalic, no gross deformities. Eyes: no gross deformities noted. ENT: ears appear grossly normal Neurologic: Alert and oriented. No involuntary movements.    EPWORTH SLEEPINESS SCALE:  Scale:  (0)= no chance of dozing; (1)= slight chance of dozing; (2)= moderate chance of dozing; (3)= high chance of dozing  Chance  Situtation    Sitting and reading: 2    Watching TV: 0    Sitting Inactive in public: 1    As a passenger in car: 1      Lying down to rest: 2    Sitting and talking: 0    Sitting quielty after lunch: 1    In a car, stopped in traffic: 0   TOTAL SCORE:   7 out of 24    SLEEP STUDIES:  PSG 07/12/19 AHI 9 SpO42min 83%   CPAP COMPLIANCE DATA:  Date Range: 04/10/21-10/06/21  Average Daily Use: 2.0  hours  Median Use: 3:26 hours  Compliance for > 4 Hours: 19% days  AHI: 3.8 respiratory events per hour  Days Used: 102/180  Mask Leak: 16.5 lpm  95th Percentile Pressure: 10.4 cmH2O         LABS: No results found for this or any previous visit (from the past 2160 hour(s)).  Radiology: DG Abd 2 Views  Result Date: 02/24/2021 CLINICAL DATA:  Right flank pain. EXAM: ABDOMEN - 2 VIEW COMPARISON:  CT scan 10/15/2019 FINDINGS: The lung bases are grossly clear. Unremarkable abdominal bowel gas pattern. The soft tissue shadows are maintained. No obvious renal or bladder calculi. No definite calcifications along the expected course of the ureters bilaterally. IMPRESSION: No plain film findings for an acute abdominal process. No obvious renal calculi. Electronically Signed   By: Marijo Sanes M.D.   On: 02/24/2021 13:27    No results found.  No results found.    Assessment and Plan: Patient Active Problem List   Diagnosis Date Noted    Sprain of ligament of tarsometatarsal joint 02/24/2021   OSA on CPAP 01/11/2021   Hyperlipidemia    CPAP use counseling    Rectal polyp    Alcohol abuse 09/22/2015   Substance abuse ([place]) 09/22/2015   Hypothyroidism     1. OSA on CPAP The patient does tolerate PAP and reports  benefit from PAP use. The patient was reminded how to clean equipment and advised to replace supplies routinely. The patient was also counselled on weight loss. The compliance is poor. The AHI is 3.8.   OSA- increase compliance with cpap.    2. CPAP use counseling CPAP Counseling: had a lengthy discussion with the patient regarding the importance of PAP  therapy in management of the sleep apnea. Patient appears to understand the risk factor reduction and also understands the risks associated with untreated sleep apnea. Patient will try to make a good faith effort to remain compliant with therapy. Also instructed the patient on proper cleaning of the device including the water must be changed daily if possible and use of distilled water is preferred. Patient understands that the machine should be regularly cleaned with appropriate recommended cleaning solutions that do not damage the PAP machine for example given white vinegar and water rinses. Other methods such as ozone treatment may not be as good as these simple methods to achieve cleaning.    .   General Counseling: I have discussed the findings of the evaluation and examination with [facility].  I have also discussed any further diagnostic evaluation thatmay be needed or ordered today. [place] verbalizes understanding of the findings of todays visit. We also reviewed his medications today and discussed drug interactions and side effects including but not limited excessive drowsiness and altered mental states. We also discussed that there is always a risk not just to him but also people around him. he has been encouraged to call the office with any questions or concerns  that should arise related to todays visit.  No orders of the defined types were placed in this encounter.       I have personally obtained a history, examined the patient, evaluated laboratory and imaging results, formulated the assessment and plan and placed orders. This patient was seen today by Tressie Ellis, PA-C in collaboration with Dr. Devona Konig.   Allyne Gee, MD [facility] Diplomate ABMS Pulmonary Critical Care Medicine and Sleep Medicine

## 2021-10-11 ENCOUNTER — Ambulatory Visit (INDEPENDENT_AMBULATORY_CARE_PROVIDER_SITE_OTHER): Payer: 59 | Admitting: Internal Medicine

## 2021-10-11 ENCOUNTER — Other Ambulatory Visit: Payer: Self-pay

## 2021-10-11 VITALS — BP 113/78 | HR 74 | Resp 18 | Ht 66.0 in | Wt 176.9 lb

## 2021-10-11 DIAGNOSIS — Z9989 Dependence on other enabling machines and devices: Secondary | ICD-10-CM

## 2021-10-11 DIAGNOSIS — G4733 Obstructive sleep apnea (adult) (pediatric): Secondary | ICD-10-CM

## 2021-10-11 DIAGNOSIS — Z7189 Other specified counseling: Secondary | ICD-10-CM | POA: Diagnosis not present

## 2021-10-11 NOTE — Patient Instructions (Signed)

## 2021-12-21 ENCOUNTER — Encounter: Payer: Self-pay | Admitting: Nurse Practitioner

## 2021-12-21 ENCOUNTER — Ambulatory Visit (INDEPENDENT_AMBULATORY_CARE_PROVIDER_SITE_OTHER): Payer: 59 | Admitting: Nurse Practitioner

## 2021-12-21 ENCOUNTER — Other Ambulatory Visit: Payer: Self-pay

## 2021-12-21 VITALS — BP 125/81 | HR 92 | Temp 98.0°F | Wt 174.2 lb

## 2021-12-21 DIAGNOSIS — J029 Acute pharyngitis, unspecified: Secondary | ICD-10-CM | POA: Diagnosis not present

## 2021-12-21 DIAGNOSIS — J020 Streptococcal pharyngitis: Secondary | ICD-10-CM | POA: Diagnosis not present

## 2021-12-21 LAB — RAPID STREP SCREEN (MED CTR MEBANE ONLY): Strep Gp A Ag, IA W/Reflex: POSITIVE — AB

## 2021-12-21 LAB — VERITOR FLU A/B WAIVED
Influenza A: NEGATIVE
Influenza B: NEGATIVE

## 2021-12-21 MED ORDER — AMOXICILLIN 500 MG PO TABS: 500.0000 mg | ORAL_TABLET | Freq: Two times a day (BID) | ORAL | 0 refills | Status: DC

## 2021-12-21 MED ORDER — LIDOCAINE VISCOUS HCL 2 % MT SOLN: 5.0000 mL | Freq: Four times a day (QID) | OROMUCOSAL | 0 refills | Status: DC | PRN

## 2021-12-21 NOTE — Progress Notes (Signed)
Acute Office Visit  Subjective:    Patient ID: Earl Baker, male    DOB: 1966/10/17, 56 y.o.   MRN: [id number]  Chief Complaint  Patient presents with   Sore Throat    Patient states he is having a real bad sore throat, has been sick for about a week. Patient has nasal congestion, had fever a few days ago.    HPI Patient is in today for sore throat and nasal congestion for 1 week.   UPPER RESPIRATORY TRACT INFECTION  Worst symptom: sore throat Fever: yes Cough: no Shortness of breath: no Wheezing: no Chest pain: no Chest tightness: no Chest congestion: no Nasal congestion: yes Runny nose: yes Post nasal drip: yes Sneezing: no Sore throat: yes Swollen glands: yes Sinus pressure: no Headache: yes Face pain: no Toothache: no Ear pain: no  Ear pressure: no  Eyes red/itching:no Eye drainage/crusting: no  Vomiting: no Rash: no Fatigue: yes Sick contacts: no Strep contacts: no  Context: stable Recurrent sinusitis: no Relief with OTC cold/cough medications: no  Treatments attempted: mucinex, ibuprofen, lozenges     Past Medical History:  Diagnosis Date   Alcoholism /alcohol abuse 09/22/2015   Arthritis    Hep C w/o coma, chronic (HCC)    Treated.  Pre 2010.   Hyperlipidemia    Hypothyroidism    Low back pain    Olecranon bursitis 07/24/2019   Substance abuse ([place])     Past Surgical History:  Procedure Laterality Date   APPENDECTOMY     COLONOSCOPY WITH PROPOFOL N/A 01/03/2019   Procedure: COLONOSCOPY WITH PROPOFOL;  Surgeon: Lucilla Lame, MD;  Location: [facility];  Service: Endoscopy;  Laterality: N/A;   POLYPECTOMY  01/03/2019   Procedure: POLYPECTOMY;  Surgeon: Lucilla Lame, MD;  Location: [facility];  Service: Endoscopy;;    Family History  Problem Relation Age of Onset   Hyperlipidemia Father    Cancer Father        prostate   Alcohol abuse Brother     Social History   Socioeconomic History   Marital status: Single     Spouse name: Not on file   Number of children: Not on file   Years of education: Not on file   Highest education level: Not on file  Occupational History   Not on file  Tobacco Use   Smoking status: Every Day    Packs/day: 0.25    Years: 30.00    Pack years: 7.50    Types: Cigarettes, E-cigarettes   Smokeless tobacco: Never  Vaping Use   Vaping Use: Every day   Substances: Nicotine, Flavoring  Substance and Sexual Activity   Alcohol use: Not Currently    Alcohol/week: 36.0 standard drinks    Types: 36 Cans of beer per week    Comment: pt reports quit a year ago    Drug use: Not Currently    Types: Cocaine    Comment: pt reports quit    Sexual activity: Not on file  Other Topics Concern   Not on file  Social History Narrative   Not on file   Social Determinants of Health   Financial Resource Strain: Not on file  Food Insecurity: Not on file  Transportation Needs: Not on file  Physical Activity: Not on file  Stress: Not on file  Social Connections: Not on file  Intimate Partner Violence: Not on file    Outpatient Medications Prior to Visit  Medication Sig Dispense Refill   ASPIRIN  81 PO Take by mouth.     disulfiram (ANTABUSE) 250 MG tablet TAKE 1 TABLET BY MOUTH EVERY DAY 90 tablet 3   levothyroxine (SYNTHROID) 75 MCG tablet TAKE 1 TABLET (75 MCG TOTAL) BY MOUTH DAILY BEFORE BREAKFAST. 90 tablet 3   Multiple Vitamins-Minerals (MULTIVITAMIN ADULT PO) Take by mouth.     Omega-3 Fatty Acids (FISH OIL PO) Take by mouth.     tamsulosin (FLOMAX) 0.4 MG CAPS capsule TAKE 1 CAPSULE BY MOUTH EVERY DAY (Patient not taking: Reported on 12/21/2021) 90 capsule 0   triamcinolone ointment (KENALOG) 0.5 % Apply 1 application topically 2 (two) times daily. (Patient not taking: Reported on 12/21/2021) 30 g 0   No facility-administered medications prior to visit.    No Known Allergies  Review of Systems  Constitutional:  Positive for fatigue and fever.  HENT:  Positive for  congestion, postnasal drip, rhinorrhea and sore throat. Negative for sinus pressure and sneezing.   Eyes: Negative.   Respiratory:  Negative for cough and shortness of breath.   Cardiovascular: Negative.   Gastrointestinal: Negative.   Endocrine: Negative.   Genitourinary: Negative.   Musculoskeletal: Negative.   Skin: Negative.   Neurological:  Positive for headaches. Negative for dizziness.  Psychiatric/Behavioral: Negative.        Objective:    Physical Exam Vitals and nursing note reviewed.  Constitutional:      Appearance: Normal appearance.  HENT:     Head: Normocephalic.     Right Ear: Tympanic membrane, ear canal and external ear normal.     Left Ear: Tympanic membrane, ear canal and external ear normal.     Mouth/Throat:     Pharynx: Posterior oropharyngeal erythema present. No oropharyngeal exudate.  Eyes:     Conjunctiva/sclera: Conjunctivae normal.  Cardiovascular:     Rate and Rhythm: Normal rate and regular rhythm.     Pulses: Normal pulses.     Heart sounds: Normal heart sounds.  Pulmonary:     Effort: Pulmonary effort is normal.     Breath sounds: Normal breath sounds.  Musculoskeletal:     Cervical back: Normal range of motion.  Skin:    General: Skin is warm.  Neurological:     General: No focal deficit present.     Mental Status: He is alert and oriented to person, place, and time.  Psychiatric:        Mood and Affect: Mood normal.        Behavior: Behavior normal.        Thought Content: Thought content normal.        Judgment: Judgment normal.    BP 125/81    Pulse 92    Temp 98 F (36.7 C)    Wt 174 lb 3.2 oz (79 kg)    SpO2 98%    BMI 28.12 kg/m  Wt Readings from Last 3 Encounters:  12/21/21 174 lb 3.2 oz (79 kg)  10/11/21 176 lb 14.4 oz (80.2 kg)  07/14/21 171 lb 9.6 oz (77.8 kg)    Health Maintenance Due  Topic Date Due   COVID-19 Vaccine (1) Never done   Pneumococcal Vaccine 72-12 Years old (2 - PCV) 01/19/2021   INFLUENZA VACCINE   07/19/2021    There are no preventive care reminders to display for this patient.   Lab Results  Component Value Date   TSH 1.190 01/22/2021   Lab Results  Component Value Date   WBC 8.8 01/22/2021   HGB 14.4 01/22/2021  HCT 45.0 01/22/2021   MCV 88 01/22/2021   PLT 294 01/22/2021   Lab Results  Component Value Date   NA 140 01/22/2021   K 4.3 01/22/2021   CO2 14 (L) 01/22/2021   GLUCOSE 94 01/22/2021   BUN 14 01/22/2021   CREATININE 0.85 01/22/2021   BILITOT 0.7 01/22/2021   ALKPHOS 100 01/22/2021   AST 19 01/22/2021   ALT 20 01/22/2021   PROT 6.9 01/22/2021   ALBUMIN 4.5 01/22/2021   CALCIUM 9.7 01/22/2021   ANIONGAP 12 08/01/2014   Lab Results  Component Value Date   CHOL 189 01/22/2021   Lab Results  Component Value Date   HDL 38 (L) 01/22/2021   Lab Results  Component Value Date   LDLCALC 130 (H) 01/22/2021   Lab Results  Component Value Date   TRIG 114 01/22/2021   Lab Results  Component Value Date   CHOLHDL 4.9 12/03/2018   Lab Results  Component Value Date   HGBA1C 5.5 05/05/2021       Assessment & Plan:   Problem List Items Addressed This Visit   None Visit Diagnoses     Strep pharyngitis    -  Primary   Will treat with amoxicillin. Ibuprofen, gargle warm salt water, magic mouthwash prn sore throat. F/U if not improving.    Sore throat       Flu negative, positive for strep. Will treat with amoxicillin. magic mouthwash prn sore throat. Gargle with warm salt water. F/U if not improving   Relevant Orders   Novel Coronavirus, NAA ([facility])   Rapid Strep Screen ([facility] ONLY)   Veritor Flu A/B Waived        Meds ordered this encounter  Medications   magic mouthwash (lidocaine, diphenhydrAMINE, alum & mag hydroxide) suspension    Sig: Swish and swallow 5 mLs 4 (four) times daily as needed for mouth pain.    Dispense:  360 mL    Refill:  0   amoxicillin (AMOXIL) 500 MG tablet    Sig: Take 1 tablet (500 mg total) by  mouth 2 (two) times daily.    Dispense:  20 tablet    Refill:  0     Charyl Dancer, NP

## 2021-12-21 NOTE — Patient Instructions (Addendum)
Gargle with warm salt water for sore throat Drink plenty of fluids Can continue halls lozenges  Take magic mouthwash as needed fore sore throat Start antibiotic twice a day for 10 days (amoxcillin)

## 2021-12-22 LAB — NOVEL CORONAVIRUS, NAA: SARS-CoV-2, NAA: NOT DETECTED

## 2021-12-22 LAB — SARS-COV-2, NAA 2 DAY TAT

## 2022-01-03 ENCOUNTER — Ambulatory Visit: Payer: 59 | Admitting: Dermatology

## 2022-01-17 ENCOUNTER — Other Ambulatory Visit: Payer: Self-pay

## 2022-01-17 ENCOUNTER — Other Ambulatory Visit: Payer: 59

## 2022-01-17 DIAGNOSIS — F1910 Other psychoactive substance abuse, uncomplicated: Secondary | ICD-10-CM

## 2022-01-17 DIAGNOSIS — Z0000 Encounter for general adult medical examination without abnormal findings: Secondary | ICD-10-CM

## 2022-01-17 LAB — BAYER DCA HB A1C WAIVED: HB A1C (BAYER DCA - WAIVED): 5.6 % (ref 4.8–5.6)

## 2022-01-18 LAB — LIPID PANEL
Chol/HDL Ratio: 4.2 ratio (ref 0.0–5.0)
Cholesterol, Total: 185 mg/dL (ref 100–199)
HDL: 44 mg/dL (ref >39)
LDL Chol Calc (NIH): 128 mg/dL — ABNORMAL HIGH (ref 0–99)
Triglycerides: 72 mg/dL (ref 0–149)
VLDL Cholesterol Cal: 13 mg/dL (ref 5–40)

## 2022-01-18 LAB — PSA TOTAL+% FREE (SERIAL)
PSA, Free Pct: 18 %
PSA, Free: 0.27 ng/mL
Prostate Specific Ag, Serum: 1.5 ng/mL (ref 0.0–4.0)

## 2022-01-18 LAB — CMP14+EGFR
ALT: 26 IU/L (ref 0–44)
AST: 22 IU/L (ref 0–40)
Albumin/Globulin Ratio: 1.8 (ref 1.2–2.2)
Albumin: 4.5 g/dL (ref 3.8–4.9)
Alkaline Phosphatase: 91 IU/L (ref 44–121)
BUN/Creatinine Ratio: 13 (ref 9–20)
BUN: 13 mg/dL (ref 6–24)
Bilirubin Total: 0.5 mg/dL (ref 0.0–1.2)
CO2: 19 mmol/L — ABNORMAL LOW (ref 20–29)
Calcium: 9.4 mg/dL (ref 8.7–10.2)
Chloride: 104 mmol/L (ref 96–106)
Creatinine, Ser: 1.04 mg/dL (ref 0.76–1.27)
Globulin, Total: 2.5 g/dL (ref 1.5–4.5)
Glucose: 94 mg/dL (ref 70–99)
Potassium: 4.1 mmol/L (ref 3.5–5.2)
Sodium: 140 mmol/L (ref 134–144)
Total Protein: 7 g/dL (ref 6.0–8.5)
eGFR: 85 mL/min/{1.73_m2} (ref >59)

## 2022-01-18 LAB — CBC WITH DIFFERENTIAL/PLATELET
Basophils Absolute: 0.1 10*3/uL (ref 0.0–0.2)
Basos: 1 %
EOS (ABSOLUTE): 0.3 10*3/uL (ref 0.0–0.4)
Eos: 4 %
Hematocrit: 42.7 % (ref 37.5–51.0)
Hemoglobin: 13.9 g/dL (ref 13.0–17.7)
Immature Grans (Abs): 0 10*3/uL (ref 0.0–0.1)
Immature Granulocytes: 0 %
Lymphocytes Absolute: 3.7 10*3/uL — ABNORMAL HIGH (ref 0.7–3.1)
Lymphs: 45 %
MCH: 28.7 pg (ref 26.6–33.0)
MCHC: 32.6 g/dL (ref 31.5–35.7)
MCV: 88 fL (ref 79–97)
Monocytes Absolute: 0.7 10*3/uL (ref 0.1–0.9)
Monocytes: 9 %
Neutrophils Absolute: 3.4 10*3/uL (ref 1.4–7.0)
Neutrophils: 41 %
Platelets: 232 10*3/uL (ref 150–450)
RBC: 4.85 x10E6/uL (ref 4.14–5.80)
RDW: 13.1 % (ref 11.6–15.4)
WBC: 8.2 10*3/uL (ref 3.4–10.8)

## 2022-01-18 LAB — THYROID PANEL WITH TSH
Free Thyroxine Index: 2.3 (ref 1.2–4.9)
T3 Uptake Ratio: 30 % (ref 24–39)
T4, Total: 7.8 ug/dL (ref 4.5–12.0)
TSH: 1.22 u[IU]/mL (ref 0.450–4.500)

## 2022-01-24 ENCOUNTER — Ambulatory Visit
Admission: RE | Admit: 2022-01-24 | Discharge: 2022-01-24 | Disposition: A | Discharge status: Home / Self Care | Payer: 59 | Source: Ambulatory Visit | Attending: Internal Medicine | Admitting: Internal Medicine

## 2022-01-24 ENCOUNTER — Ambulatory Visit
Admission: RE | Admit: 2022-01-24 | Discharge: 2022-01-24 | Disposition: A | Discharge status: Home / Self Care | Payer: 59 | Attending: Internal Medicine | Admitting: Internal Medicine

## 2022-01-24 ENCOUNTER — Encounter: Payer: Self-pay | Admitting: Internal Medicine

## 2022-01-24 ENCOUNTER — Other Ambulatory Visit: Payer: Self-pay

## 2022-01-24 ENCOUNTER — Ambulatory Visit (INDEPENDENT_AMBULATORY_CARE_PROVIDER_SITE_OTHER): Payer: 59 | Admitting: Internal Medicine

## 2022-01-24 VITALS — BP 100/64 | HR 61 | Temp 98.5°F | Ht 65.98 in | Wt 174.8 lb

## 2022-01-24 DIAGNOSIS — Z0000 Encounter for general adult medical examination without abnormal findings: Secondary | ICD-10-CM | POA: Diagnosis not present

## 2022-01-24 DIAGNOSIS — M25562 Pain in left knee: Secondary | ICD-10-CM

## 2022-01-24 DIAGNOSIS — E039 Hypothyroidism, unspecified: Secondary | ICD-10-CM | POA: Diagnosis not present

## 2022-01-24 DIAGNOSIS — F1010 Alcohol abuse, uncomplicated: Secondary | ICD-10-CM | POA: Diagnosis not present

## 2022-01-24 DIAGNOSIS — G8929 Other chronic pain: Secondary | ICD-10-CM | POA: Insufficient documentation

## 2022-01-24 DIAGNOSIS — E785 Hyperlipidemia, unspecified: Secondary | ICD-10-CM

## 2022-01-24 NOTE — Progress Notes (Signed)
BP 100/64    Pulse 61    Temp 98.5 F (36.9 C) (Oral)    Ht 5' 5.98" (1.676 m)    Wt 174 lb 12.8 oz (79.3 kg)    SpO2 97%    BMI 28.23 kg/m    Subjective:    Patient ID: Earl Baker, male    DOB: 10-Nov-1966, 56 y.o.   MRN: [id number]  Chief Complaint  Patient presents with   Annual Exam    HPI: Earl Baker is a 56 y.o. male  HPI  Chief Complaint  Patient presents with   Annual Exam    Relevant past medical, surgical, family and social history reviewed and updated as indicated. Interim medical history since our last visit reviewed. Allergies and medications reviewed and updated.  Review of Systems  Per HPI unless specifically indicated above     Objective:    BP 100/64    Pulse 61    Temp 98.5 F (36.9 C) (Oral)    Ht 5' 5.98" (1.676 m)    Wt 174 lb 12.8 oz (79.3 kg)    SpO2 97%    BMI 28.23 kg/m   Wt Readings from Last 3 Encounters:  01/24/22 174 lb 12.8 oz (79.3 kg)  12/21/21 174 lb 3.2 oz (79 kg)  10/11/21 176 lb 14.4 oz (80.2 kg)    Physical Exam Vitals and nursing note reviewed.  Constitutional:      General: He is not in acute distress.    Appearance: Normal appearance. He is not ill-appearing or diaphoretic.  HENT:     Head: Normocephalic and atraumatic.     Right Ear: Tympanic membrane and external ear normal. There is no impacted cerumen.     Left Ear: External ear normal.     Nose: No congestion or rhinorrhea.     Mouth/Throat:     Pharynx: No oropharyngeal exudate or posterior oropharyngeal erythema.  Eyes:     Conjunctiva/sclera: Conjunctivae normal.     Pupils: Pupils are equal, round, and reactive to light.  Cardiovascular:     Rate and Rhythm: Normal rate and regular rhythm.     Heart sounds: No murmur heard.   No friction rub. No gallop.  Pulmonary:     Effort: No respiratory distress.     Breath sounds: No stridor. No wheezing or rhonchi.  Chest:     Chest wall: No tenderness.  Abdominal:     General: Abdomen is  flat. Bowel sounds are normal.     Palpations: Abdomen is soft. There is no mass.     Tenderness: There is no abdominal tenderness.  Musculoskeletal:        General: No swelling, tenderness, deformity or signs of injury. Normal range of motion.     Cervical back: Normal range of motion and neck supple. No rigidity or tenderness.     Right lower leg: No edema.     Left lower leg: No edema.  Skin:    General: Skin is warm and dry.  Neurological:     Mental Status: He is alert.  Psychiatric:        Mood and Affect: Mood normal.        Behavior: Behavior normal.        Thought Content: Thought content normal.        Judgment: Judgment normal.    Results for orders placed or performed in visit on 01/17/22  CMP14+EGFR  Result Value Ref Range   Glucose 94  70 - 99 mg/dL   BUN 13 6 - 24 mg/dL   Creatinine, Ser 1.04 0.76 - 1.27 mg/dL   eGFR 85 >59 mL/min/1.73   BUN/Creatinine Ratio 13 9 - 20   Sodium 140 134 - 144 mmol/L   Potassium 4.1 3.5 - 5.2 mmol/L   Chloride 104 96 - 106 mmol/L   CO2 19 (L) 20 - 29 mmol/L   Calcium 9.4 8.7 - 10.2 mg/dL   Total Protein 7.0 6.0 - 8.5 g/dL   Albumin 4.5 3.8 - 4.9 g/dL   Globulin, Total 2.5 1.5 - 4.5 g/dL   Albumin/Globulin Ratio 1.8 1.2 - 2.2   Bilirubin Total 0.5 0.0 - 1.2 mg/dL   Alkaline Phosphatase 91 44 - 121 IU/L   AST 22 0 - 40 IU/L   ALT 26 0 - 44 IU/L  Lipid panel  Result Value Ref Range   Cholesterol, Total 185 100 - 199 mg/dL   Triglycerides 72 0 - 149 mg/dL   HDL 44 >39 mg/dL   VLDL Cholesterol Cal 13 5 - 40 mg/dL   LDL Chol Calc (NIH) 128 (H) 0 - 99 mg/dL   Chol/HDL Ratio 4.2 0.0 - 5.0 ratio  Bayer DCA Hb A1c Waived  Result Value Ref Range   HB A1C (BAYER DCA - WAIVED) 5.6 4.8 - 5.6 %  Thyroid Panel With TSH  Result Value Ref Range   TSH 1.220 0.450 - 4.500 uIU/mL   T4, Total 7.8 4.5 - 12.0 ug/dL   T3 Uptake Ratio 30 24 - 39 %   Free Thyroxine Index 2.3 1.2 - 4.9  CBC with Differential/Platelet  Result Value Ref Range    WBC 8.2 3.4 - 10.8 x10E3/uL   RBC 4.85 4.14 - 5.80 x10E6/uL   Hemoglobin 13.9 13.0 - 17.7 g/dL   Hematocrit 42.7 37.5 - 51.0 %   MCV 88 79 - 97 fL   MCH 28.7 26.6 - 33.0 pg   MCHC 32.6 31.5 - 35.7 g/dL   RDW 13.1 11.6 - 15.4 %   Platelets 232 150 - 450 x10E3/uL   Neutrophils 41 Not Estab. %   Lymphs 45 Not Estab. %   Monocytes 9 Not Estab. %   Eos 4 Not Estab. %   Basos 1 Not Estab. %   Neutrophils Absolute 3.4 1.4 - 7.0 x10E3/uL   Lymphocytes Absolute 3.7 (H) 0.7 - 3.1 x10E3/uL   Monocytes Absolute 0.7 0.1 - 0.9 x10E3/uL   EOS (ABSOLUTE) 0.3 0.0 - 0.4 x10E3/uL   Basophils Absolute 0.1 0.0 - 0.2 x10E3/uL   Immature Granulocytes 0 Not Estab. %   Immature Grans (Abs) 0.0 0.0 - 0.1 x10E3/uL  PSA Total+%Free (Serial)  Result Value Ref Range   Prostate Specific Ag, Serum 1.5 0.0 - 4.0 ng/mL   PSA, Free 0.27 N/A ng/mL   PSA, Free Pct 18.0 %        Current Outpatient Medications:    ASPIRIN 81 PO, Take by mouth., Disp: , Rfl:    disulfiram (ANTABUSE) 250 MG tablet, TAKE 1 TABLET BY MOUTH EVERY DAY, Disp: 90 tablet, Rfl: 3   levothyroxine (SYNTHROID) 75 MCG tablet, TAKE 1 TABLET (75 MCG TOTAL) BY MOUTH DAILY BEFORE BREAKFAST., Disp: 90 tablet, Rfl: 3   Multiple Vitamins-Minerals (MULTIVITAMIN ADULT PO), Take by mouth., Disp: , Rfl:    Omega-3 Fatty Acids (FISH OIL PO), Take by mouth., Disp: , Rfl:     Assessment & Plan:     1. Alcohol abuse is on  disulfiram by last pcp. Stop now.   Has been taking this consistently x 2 yrs now has been without    2. Hypothyroid is on 75 mcg of synthroid for such  Chronic, ongoing with recent TSH within normal range.  Continue current medication regimen and adjust as needed.  Plan to recheck TSH at physical in 6 months.   3. Knee pain left sided :  Check xrays of such.   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    No orders of the defined types were placed in this encounter.    Follow up plan: No follow-ups on file.

## 2022-01-24 NOTE — Progress Notes (Signed)
Please let pt know this was normal. Pl let him know to take otc tyelnol for such

## 2022-01-25 ENCOUNTER — Telehealth: Payer: Self-pay | Admitting: Internal Medicine

## 2022-01-25 NOTE — Telephone Encounter (Signed)
Copied from [place] [phone]. Topic: Referral - Request for Referral >> Jan 25, 2022  2:08 PM Tessa Lerner A wrote: Has patient seen PCP for this complaint? Yes.   *If NO, is insurance requiring patient see PCP for this issue before PCP can refer them? Referral for which specialty: Orthopedic Specialist  Preferred provider/office: Patient has no preference  Reason for referral: Left knee discomfort

## 2022-01-26 DIAGNOSIS — Z0000 Encounter for general adult medical examination without abnormal findings: Secondary | ICD-10-CM | POA: Insufficient documentation

## 2022-01-26 DIAGNOSIS — G8929 Other chronic pain: Secondary | ICD-10-CM | POA: Insufficient documentation

## 2022-01-26 MED ORDER — MELOXICAM 7.5 MG PO TABS: 7.5000 mg | ORAL_TABLET | Freq: Every day | ORAL | 0 refills | Status: AC

## 2022-06-03 ENCOUNTER — Encounter: Payer: Self-pay | Admitting: Internal Medicine

## 2022-07-19 ENCOUNTER — Ambulatory Visit: Payer: 59 | Admitting: Unknown Physician Specialty

## 2022-07-25 ENCOUNTER — Other Ambulatory Visit: Payer: Self-pay

## 2022-07-25 ENCOUNTER — Other Ambulatory Visit: Payer: Self-pay | Admitting: Nurse Practitioner

## 2022-07-25 DIAGNOSIS — E039 Hypothyroidism, unspecified: Secondary | ICD-10-CM

## 2022-07-25 MED ORDER — LEVOTHYROXINE SODIUM 75 MCG PO TABS: 75.0000 ug | ORAL_TABLET | Freq: Every day | ORAL | 0 refills | Status: DC

## 2022-07-25 NOTE — Telephone Encounter (Signed)
LOV 02/03/22  No Future appt   Last labs 12/21/21

## 2022-07-26 ENCOUNTER — Ambulatory Visit: Payer: 59 | Admitting: Unknown Physician Specialty

## 2022-08-31 ENCOUNTER — Other Ambulatory Visit: Payer: Self-pay | Admitting: Nurse Practitioner

## 2022-08-31 DIAGNOSIS — E039 Hypothyroidism, unspecified: Secondary | ICD-10-CM

## 2022-09-01 NOTE — Telephone Encounter (Signed)
Requested medication (s) are due for refill today:   Yes  Requested medication (s) are on the active medication list:   Yes  Future visit scheduled:   No   Message left to call in and schedule physical.   Cancelled recent appt. With Kathrine Haddock   Last ordered: 07/25/2022 #30, 0 refills   Returned because 30 day courtesy refill has been given in Aug.      Requested Prescriptions  Pending Prescriptions Disp Refills   levothyroxine (SYNTHROID) 75 MCG tablet [Pharmacy Med Name: LEVOTHYROXINE 75 MCG TABLET] 30 tablet 0    Sig: Take 1 tablet (75 mcg total) by mouth daily before breakfast. Need office visit for further refills.     Endocrinology:  Hypothyroid Agents Passed - 08/31/2022  2:15 AM      Passed - TSH in normal range and within 360 days    TSH  Date Value Ref Range Status  01/17/2022 1.220 0.450 - 4.500 uIU/mL Final         Passed - Valid encounter within last 12 months    Recent Outpatient Visits           7 months ago Chronic pain of left knee   [facility] Vigg, Avanti, MD   8 months ago Strep pharyngitis   [facility] McElwee, Lauren A, NP   1 year ago Change in mole   [facility] Vigg, Avanti, MD   1 year ago Herpes   [facility] Vigg, Avanti, MD   1 year ago Herpes zoster without complication   [facility] Vigg, Avanti, MD

## 2022-09-28 ENCOUNTER — Other Ambulatory Visit: Payer: Self-pay | Admitting: Nurse Practitioner

## 2022-09-28 DIAGNOSIS — E039 Hypothyroidism, unspecified: Secondary | ICD-10-CM

## 2022-09-29 NOTE — Telephone Encounter (Signed)
Requested medications are due for refill today.  yes  Requested medications are on the active medications list.  yes  Last refill. 07/25/2022 #30 0 rf  Future visit scheduled.   no  Notes to clinic.  Dr. Neomia Dear pt. Courtesy refill already given.    Requested Prescriptions  Pending Prescriptions Disp Refills   levothyroxine (SYNTHROID) 75 MCG tablet [Pharmacy Med Name: LEVOTHYROXINE 75 MCG TABLET] 30 tablet 0    Sig: Take 1 tablet (75 mcg total) by mouth daily before breakfast. Need office visit for further refills.     Endocrinology:  Hypothyroid Agents Passed - 09/28/2022  5:16 PM      Passed - TSH in normal range and within 360 days    TSH  Date Value Ref Range Status  01/17/2022 1.220 0.450 - 4.500 uIU/mL Final         Passed - Valid encounter within last 12 months    Recent Outpatient Visits           8 months ago Chronic pain of left knee   [facility] Vigg, Avanti, MD   9 months ago Strep pharyngitis   [facility] McElwee, Lauren A, NP   1 year ago Change in mole   [facility] Vigg, Avanti, MD   1 year ago Herpes   [facility] Vigg, Avanti, MD   1 year ago Herpes zoster without complication   [facility] Vigg, Avanti, MD

## 2022-09-30 ENCOUNTER — Ambulatory Visit: Payer: Self-pay | Admitting: *Deleted

## 2022-09-30 DIAGNOSIS — E039 Hypothyroidism, unspecified: Secondary | ICD-10-CM

## 2022-09-30 NOTE — Telephone Encounter (Signed)
Pt is calling to ask why is an office visit needed for medication refill for levothyroxine (SYNTHROID) 75 MCG tablet [[id number]] ? Pt states that he just had a CPE appt please advise       Chief Complaint: Medication Symptoms: NA Frequency: NA Pertinent Negatives: Patient denies NA Disposition: '[]'$ ED /'[]'$ Urgent Care (no appt availability in office) / '[]'$ Appointment(In office/virtual)/ '[]'$  [facility]/ '[]'$ Home Care/ '[]'$ Refused Recommended Disposition /'[]'$  Mobile Bus/  '[x]'$ *Follow-up with PCP Additional Notes: Pt's synthroid refused x 2. Originally refilled by Dr. Neomia Dear. Pt's last office appt. 01/17/22. Secured TOC appt for 09/29/22 with Jolene. Pt states he has 2 tabs left. Please review. Thank you. Reason for Disposition  [1] Caller has NON-URGENT medicine question about med that PCP prescribed AND [2] triager unable to answer question  Additional Information  Negative: Medicine patch causing local rash or itching    Med refill pending  Answer Assessment - Initial Assessment Questions 1. NAME of MEDICINE: "What medicine(s) are you calling about?"     Synthroid 2. QUESTION: "What is your question?" (e.g., double dose of medicine, side effect)     Why do I need appt 3. PRESCRIBER: "Who prescribed the medicine?" Reason: if prescribed by specialist, call should be referred to that group.     Pt is calling to ask why is an office visit needed for medication refill for levothyroxine (SYNTHROID) 75 MCG tablet [[id number]] ? Pt states that he just had a CPE appt please advise  Protocols used: Medication Question Call-A-AH

## 2022-10-03 MED ORDER — LEVOTHYROXINE SODIUM 75 MCG PO TABS: 75.0000 ug | ORAL_TABLET | Freq: Every day | ORAL | 0 refills | Status: DC

## 2022-10-03 NOTE — Telephone Encounter (Signed)
Routing to provider who patient has an upcoming appt with. Can we send in enough to get to the patient's appointment on 10/10/22?

## 2022-10-03 NOTE — Telephone Encounter (Signed)
Called and LVM letting patient know that medication was sent in to get to his upcoming appointment.

## 2022-10-09 NOTE — Patient Instructions (Signed)

## 2022-10-10 ENCOUNTER — Ambulatory Visit (INDEPENDENT_AMBULATORY_CARE_PROVIDER_SITE_OTHER): Payer: 59 | Admitting: Nurse Practitioner

## 2022-10-10 ENCOUNTER — Encounter: Payer: Self-pay | Admitting: Nurse Practitioner

## 2022-10-10 VITALS — BP 122/75 | HR 66 | Temp 98.1°F | Resp 18 | Ht 65.98 in | Wt 177.8 lb

## 2022-10-10 DIAGNOSIS — F17210 Nicotine dependence, cigarettes, uncomplicated: Secondary | ICD-10-CM | POA: Diagnosis not present

## 2022-10-10 DIAGNOSIS — E782 Mixed hyperlipidemia: Secondary | ICD-10-CM

## 2022-10-10 DIAGNOSIS — F1910 Other psychoactive substance abuse, uncomplicated: Secondary | ICD-10-CM | POA: Diagnosis not present

## 2022-10-10 DIAGNOSIS — Z23 Encounter for immunization: Secondary | ICD-10-CM

## 2022-10-10 DIAGNOSIS — E039 Hypothyroidism, unspecified: Secondary | ICD-10-CM

## 2022-10-10 DIAGNOSIS — G4733 Obstructive sleep apnea (adult) (pediatric): Secondary | ICD-10-CM

## 2022-10-10 MED ORDER — NALTREXONE HCL 50 MG PO TABS: ORAL_TABLET | ORAL | 4 refills | Status: DC

## 2022-10-10 NOTE — Progress Notes (Signed)
BP 122/75 (BP Location: Left Arm, Patient Position: Sitting, Cuff Size: Normal)   Pulse 66   Temp 98.1 F (36.7 C) (Oral)   Resp 18   Ht 5' 5.98" (1.676 m)   Wt 177 lb 12.8 oz (80.6 kg)   SpO2 98%   BMI 28.71 kg/m    Subjective:    Patient ID: Earl Baker, male    DOB: Jan 24, 1966, 57 y.o.   MRN: [id number]  HPI: Earl Baker is a 56 y.o. male  Chief Complaint  Patient presents with   Hypothyroidism   HYPOTHYROIDISM Taking Levothyroxine 75 MCG daily. Thyroid control status:stable Satisfied with current treatment? yes Medication side effects: no Medication compliance: good compliance Etiology of hypothyroidism: unknown Recent dose adjustment:no Fatigue: no Cold intolerance: no Heat intolerance: no Weight gain: no Weight loss: no Constipation: no Diarrhea/loose stools: no Palpitations: no Lower extremity edema: no Anxiety/depressed mood: no    10/10/2022    9:15 AM 01/24/2022    9:27 AM 07/14/2021    9:30 AM 05/14/2021    8:48 AM 05/05/2021    4:16 PM  Depression screen PHQ 2/9  Decreased Interest 1 1 0 0 0  Down, Depressed, Hopeless 1 0 0 0 0  PHQ - 2 Score 2 1 0 0 0  Altered sleeping 1 2     Tired, decreased energy 2 1     Change in appetite 2 1     Feeling bad or failure about yourself  1 0     Trouble concentrating 1 0     Moving slowly or fidgety/restless 0 0     Suicidal thoughts 0 0     PHQ-9 Score 9 5     Difficult doing work/chores Somewhat difficult Not difficult at all          10/10/2022    9:17 AM 01/24/2022    9:27 AM 12/03/2018    3:01 PM  GAD 7 : Generalized Anxiety Score  Nervous, Anxious, on Edge 0 0 0  Control/stop worrying 0 0 0  Worry too much - different things 1 0 0  Trouble relaxing 0 0 0  Restless 0 0 0  Easily annoyed or irritable 0 0 0  Afraid - awful might happen 0 0 0  Total GAD 7 Score 1 0 0  Anxiety Difficulty Not difficult at all Not difficult at all     HYPERLIPIDEMIA No current medications.  Quit  for 2 years, was taking Antabuse which was beneficial previous PCP took him off this.  Is interested Naltrexone.  Does not drink daily, but does drink too much when he drinks (12 pack of beer or 5th of liquor).  Hard for him to stop drinking when starts -- all his life has had this issue.  Family history of alcohol [place].    Is a current smoker, smokers 1/2 PPD.  Has been smoking since age 66.  Never been a pack per day smoker.  400 pack year smoker. Hyperlipidemia status: good compliance Past cholesterol meds: none Supplements: fish oil Aspirin:  no The 10-year ASCVD risk score (Arnett DK, et al., 2019) is: 9.2%   Values used to calculate the score:     Age: 70 years     Sex: Male     Is Non-Hispanic African American: No     Diabetic: No     Tobacco smoker: Yes     Systolic Blood Pressure: 588 mmHg     Is BP treated: No  HDL Cholesterol: 44 mg/dL     Total Cholesterol: 185 mg/dL Chest pain:  no Coronary artery disease:  no Family history CAD:  no Family history early CAD:  no  Flowsheet Row Office Visit from 10/10/2022 in [facility]  Alcohol Use Disorder Identification Test Final Score (AUDIT) 23        Relevant past medical, surgical, family and social history reviewed and updated as indicated. Interim medical history since our last visit reviewed. Allergies and medications reviewed and updated.  Review of Systems  Constitutional:  Negative for activity change, diaphoresis, fatigue and fever.  Respiratory:  Negative for cough, chest tightness, shortness of breath and wheezing.   Cardiovascular:  Negative for chest pain, palpitations and leg swelling.  Gastrointestinal: Negative.   Endocrine: Negative for cold intolerance and heat intolerance.  Neurological: Negative.   Psychiatric/Behavioral: Negative.      Per HPI unless specifically indicated above     Objective:    BP 122/75 (BP Location: Left Arm, Patient Position: Sitting, Cuff Size: Normal)   Pulse  66   Temp 98.1 F (36.7 C) (Oral)   Resp 18   Ht 5' 5.98" (1.676 m)   Wt 177 lb 12.8 oz (80.6 kg)   SpO2 98%   BMI 28.71 kg/m   Wt Readings from Last 3 Encounters:  10/10/22 177 lb 12.8 oz (80.6 kg)  01/24/22 174 lb 12.8 oz (79.3 kg)  12/21/21 174 lb 3.2 oz (79 kg)    Physical Exam Vitals and nursing note reviewed.  Constitutional:      General: He is awake. He is not in acute distress.    Appearance: He is well-developed and well-groomed. He is not ill-appearing or toxic-appearing.  HENT:     Head: Normocephalic and atraumatic.     Right Ear: Hearing normal. No drainage.     Left Ear: Hearing normal. No drainage.  Eyes:     General: Lids are normal.        Right eye: No discharge.        Left eye: No discharge.     Conjunctiva/sclera: Conjunctivae normal.     Pupils: Pupils are equal, round, and reactive to light.  Neck:     Thyroid: No thyromegaly.     Vascular: No carotid bruit.  Cardiovascular:     Rate and Rhythm: Normal rate and regular rhythm.     Heart sounds: Normal heart sounds, S1 normal and S2 normal. No murmur heard.    No gallop.  Pulmonary:     Effort: Pulmonary effort is normal. No accessory muscle usage or respiratory distress.     Breath sounds: Wheezing present.     Comments: Occasional expiratory wheezes noted, fine throughout.  No rhonchi. Abdominal:     General: Bowel sounds are normal.     Palpations: Abdomen is soft. There is no hepatomegaly or splenomegaly.  Musculoskeletal:        General: Normal range of motion.     Cervical back: Normal range of motion and neck supple.     Right lower leg: No edema.     Left lower leg: No edema.  Lymphadenopathy:     Cervical: No cervical adenopathy.  Skin:    General: Skin is warm and dry.     Capillary Refill: Capillary refill takes less than 2 seconds.  Neurological:     Mental Status: He is alert and oriented to person, place, and time.     Deep Tendon Reflexes: Reflexes are normal  and symmetric.   Psychiatric:        Attention and Perception: Attention normal.        Mood and Affect: Mood normal.        Speech: Speech normal.        Behavior: Behavior normal. Behavior is cooperative.        Thought Content: Thought content normal.     Results for orders placed or performed in visit on 01/17/22  CMP14+EGFR  Result Value Ref Range   Glucose 94 70 - 99 mg/dL   BUN 13 6 - 24 mg/dL   Creatinine, Ser 1.04 0.76 - 1.27 mg/dL   eGFR 85 >59 mL/min/1.73   BUN/Creatinine Ratio 13 9 - 20   Sodium 140 134 - 144 mmol/L   Potassium 4.1 3.5 - 5.2 mmol/L   Chloride 104 96 - 106 mmol/L   CO2 19 (L) 20 - 29 mmol/L   Calcium 9.4 8.7 - 10.2 mg/dL   Total Protein 7.0 6.0 - 8.5 g/dL   Albumin 4.5 3.8 - 4.9 g/dL   Globulin, Total 2.5 1.5 - 4.5 g/dL   Albumin/Globulin Ratio 1.8 1.2 - 2.2   Bilirubin Total 0.5 0.0 - 1.2 mg/dL   Alkaline Phosphatase 91 44 - 121 IU/L   AST 22 0 - 40 IU/L   ALT 26 0 - 44 IU/L  Lipid panel  Result Value Ref Range   Cholesterol, Total 185 100 - 199 mg/dL   Triglycerides 72 0 - 149 mg/dL   HDL 44 >39 mg/dL   VLDL Cholesterol Cal 13 5 - 40 mg/dL   LDL Chol Calc (NIH) 128 (H) 0 - 99 mg/dL   Chol/HDL Ratio 4.2 0.0 - 5.0 ratio  Bayer DCA Hb A1c Waived  Result Value Ref Range   HB A1C (BAYER DCA - WAIVED) 5.6 4.8 - 5.6 %  Thyroid Panel With TSH  Result Value Ref Range   TSH 1.220 0.450 - 4.500 uIU/mL   T4, Total 7.8 4.5 - 12.0 ug/dL   T3 Uptake Ratio 30 24 - 39 %   Free Thyroxine Index 2.3 1.2 - 4.9  CBC with Differential/Platelet  Result Value Ref Range   WBC 8.2 3.4 - 10.8 x10E3/uL   RBC 4.85 4.14 - 5.80 x10E6/uL   Hemoglobin 13.9 13.0 - 17.7 g/dL   Hematocrit 42.7 37.5 - 51.0 %   MCV 88 79 - 97 fL   MCH 28.7 26.6 - 33.0 pg   MCHC 32.6 31.5 - 35.7 g/dL   RDW 13.1 11.6 - 15.4 %   Platelets 232 150 - 450 x10E3/uL   Neutrophils 41 Not Estab. %   Lymphs 45 Not Estab. %   Monocytes 9 Not Estab. %   Eos 4 Not Estab. %   Basos 1 Not Estab. %    Neutrophils Absolute 3.4 1.4 - 7.0 x10E3/uL   Lymphocytes Absolute 3.7 (H) 0.7 - 3.1 x10E3/uL   Monocytes Absolute 0.7 0.1 - 0.9 x10E3/uL   EOS (ABSOLUTE) 0.3 0.0 - 0.4 x10E3/uL   Basophils Absolute 0.1 0.0 - 0.2 x10E3/uL   Immature Granulocytes 0 Not Estab. %   Immature Grans (Abs) 0.0 0.0 - 0.1 x10E3/uL  PSA Total+%Free (Serial)  Result Value Ref Range   Prostate Specific Ag, Serum 1.5 0.0 - 4.0 ng/mL   PSA, Free 0.27 N/A ng/mL   PSA, Free Pct 18.0 %      Assessment & Plan:   Problem List Items Addressed This Visit  Endocrine   Hypothyroidism    Chronic, ongoing.  Check thyroid labs today, TSH, FreeT4, and thyroid antibody.  Continue current medication dosage and adjust as needed.      Relevant Orders   T4, free   Thyroid peroxidase antibody   TSH     Other   Cigarette nicotine dependence without complication    I have recommended complete cessation of tobacco use. I have discussed various options available for assistance with tobacco cessation including over the counter methods (Nicotine gum, patch and lozenges). We also discussed prescription options (Chantix, Nicotine Inhaler / Nasal Spray). The patient is not interested in pursuing any prescription tobacco cessation options at this time.  He is interested in CT CA lung screening, have placed this order.       Relevant Orders   Ambulatory Referral Lung Cancer Screening [facility]   Hyperlipidemia    Chronic, ongoing with ASCVD 9.2%.  No current medications, not interested.  Will continue to discuss at visits and recommend. Lipid panel today.      Relevant Orders   Lipid Panel w/o Chol/HDL Ratio   Comprehensive metabolic panel   Substance abuse ([place]) - Primary    Chronic, ongoing issue.  He is interested in trying Naltrexone, educated him at length on this.  Will send this in to start at 50 MG daily for one week and then increase to 100 MG daily.  He is motivated to cut back and work on cessation.   Recommend he attend AA meetings.  Labs today: CMP, B12.        Relevant Orders   Vitamin B12   Other Visit Diagnoses     Need for influenza vaccination       Flu vaccine in office today.   Relevant Orders   Flu Vaccine QUAD 30moIM (Fluarix, Fluzone & Alfiuria Quad PF) (Completed)   Need for shingles vaccine       Shingrix # 1 in office today.   Relevant Orders   Zoster Recombinant (Shingrix ) (Completed)        Follow up plan: Return in about 6 weeks (around 11/21/2022) for Alcohol Abuse with Naltrexone added.

## 2022-10-10 NOTE — Assessment & Plan Note (Signed)
Chronic, ongoing with ASCVD 9.2%.  No current medications, not interested.  Will continue to discuss at visits and recommend. Lipid panel today.

## 2022-10-10 NOTE — Assessment & Plan Note (Signed)
I have recommended complete cessation of tobacco use. I have discussed various options available for assistance with tobacco cessation including over the counter methods (Nicotine gum, patch and lozenges). We also discussed prescription options (Chantix, Nicotine Inhaler / Nasal Spray). The patient is not interested in pursuing any prescription tobacco cessation options at this time.  He is interested in CT CA lung screening, have placed this order.

## 2022-10-10 NOTE — Assessment & Plan Note (Signed)
Chronic, ongoing.  Check thyroid labs today, TSH, FreeT4, and thyroid antibody.  Continue current medication dosage and adjust as needed.

## 2022-10-10 NOTE — Assessment & Plan Note (Signed)
Chronic, ongoing issue.  He is interested in trying Naltrexone, educated him at length on this.  Will send this in to start at 50 MG daily for one week and then increase to 100 MG daily.  He is motivated to cut back and work on cessation.  Recommend he attend AA meetings.  Labs today: CMP, B12.

## 2022-10-11 LAB — COMPREHENSIVE METABOLIC PANEL
ALT: 31 IU/L (ref 0–44)
AST: 25 IU/L (ref 0–40)
Albumin/Globulin Ratio: 1.8 (ref 1.2–2.2)
Albumin: 4.5 g/dL (ref 3.8–4.9)
Alkaline Phosphatase: 96 IU/L (ref 44–121)
BUN/Creatinine Ratio: 19 (ref 9–20)
BUN: 16 mg/dL (ref 6–24)
Bilirubin Total: 0.4 mg/dL (ref 0.0–1.2)
CO2: 17 mmol/L — ABNORMAL LOW (ref 20–29)
Calcium: 9.4 mg/dL (ref 8.7–10.2)
Chloride: 104 mmol/L (ref 96–106)
Creatinine, Ser: 0.84 mg/dL (ref 0.76–1.27)
Globulin, Total: 2.5 g/dL (ref 1.5–4.5)
Glucose: 102 mg/dL — ABNORMAL HIGH (ref 70–99)
Potassium: 4 mmol/L (ref 3.5–5.2)
Sodium: 139 mmol/L (ref 134–144)
Total Protein: 7 g/dL (ref 6.0–8.5)
eGFR: 104 mL/min/{1.73_m2} (ref >59)

## 2022-10-11 LAB — T4, FREE: Free T4: 1.74 ng/dL (ref 0.82–1.77)

## 2022-10-11 LAB — LIPID PANEL W/O CHOL/HDL RATIO
Cholesterol, Total: 189 mg/dL (ref 100–199)
HDL: 48 mg/dL (ref >39)
LDL Chol Calc (NIH): 97 mg/dL (ref 0–99)
Triglycerides: 258 mg/dL — ABNORMAL HIGH (ref 0–149)
VLDL Cholesterol Cal: 44 mg/dL — ABNORMAL HIGH (ref 5–40)

## 2022-10-11 LAB — TSH: TSH: 2.54 u[IU]/mL (ref 0.450–4.500)

## 2022-10-11 LAB — VITAMIN B12: Vitamin B-12: 613 pg/mL (ref 232–1245)

## 2022-10-11 LAB — THYROID PEROXIDASE ANTIBODY: Thyroperoxidase Ab SerPl-aCnc: 10 IU/mL (ref 0–34)

## 2022-11-03 ENCOUNTER — Other Ambulatory Visit: Payer: Self-pay

## 2022-11-03 DIAGNOSIS — F17210 Nicotine dependence, cigarettes, uncomplicated: Secondary | ICD-10-CM

## 2022-11-03 DIAGNOSIS — Z87891 Personal history of nicotine dependence: Secondary | ICD-10-CM

## 2022-11-03 DIAGNOSIS — Z122 Encounter for screening for malignant neoplasm of respiratory organs: Secondary | ICD-10-CM

## 2022-11-09 ENCOUNTER — Other Ambulatory Visit: Payer: Self-pay | Admitting: Nurse Practitioner

## 2022-11-09 DIAGNOSIS — E039 Hypothyroidism, unspecified: Secondary | ICD-10-CM

## 2022-11-09 NOTE — Telephone Encounter (Signed)
Requested Prescriptions  Pending Prescriptions Disp Refills   levothyroxine (SYNTHROID) 75 MCG tablet [Pharmacy Med Name: LEVOTHYROXINE 75 MCG TABLET] 90 tablet 0    Sig: TAKE 1 TABLET (75 MCG TOTAL) BY MOUTH DAILY BEFORE BREAKFAST. NEED OFFICE VISIT FOR FURTHER REFILLS.     Endocrinology:  Hypothyroid Agents Passed - 11/09/2022  2:41 PM      Passed - TSH in normal range and within 360 days    TSH  Date Value Ref Range Status  10/10/2022 2.540 0.450 - 4.500 uIU/mL Final         Passed - Valid encounter within last 12 months    Recent Outpatient Visits           1 month ago Substance abuse ([place])   [place] Cannady, Jolene T, NP   9 months ago Chronic pain of left knee   [facility] Vigg, Avanti, MD   10 months ago Strep pharyngitis   [place] McElwee, Lauren A, NP   1 year ago Change in mole   [facility] Vigg, Avanti, MD   1 year ago Herpes   [place] Vigg, Avanti, MD       Future Appointments             In 1 week Cannady, Barbaraann Faster, NP [employer], [place]

## 2022-11-20 NOTE — Patient Instructions (Incomplete)
Alcohol Use Disorder Alcohol use disorder is a condition in which drinking disrupts daily life. People with this condition drink too much alcohol and cannot control their drinking. Alcohol use disorder can cause serious problems with physical health. It can affect the brain, heart, and other internal organs. This disorder can raise the risk for certain cancers and cause problems with mental health, such as depression or anxiety. What are the causes? This condition is caused by drinking too much alcohol over time. Some people with this condition drink to cope with or escape from negative life events. Others drink to relieve symptoms of physical pain or symptoms of mental illness. What increases the risk? You are more likely to develop this condition if: You have a family history of alcohol use disorder. Your culture encourages drinking to the point of becoming drunk (intoxication). You had a mood or conduct disorder in childhood. You have been abused. You are an adolescent and you: Have poor performance in school. Have poor supervision or guidance. Act on impulse and like taking risks. What are the signs or symptoms? Symptoms of this condition include: Drinking more than you want to. Trying several times without success to drink less. Spending a lot of time thinking about alcohol, getting alcohol, drinking alcohol, or recovering from drinking alcohol. Continuing to drink even when it is causing serious problems in your daily life. Drinking when it is dangerous to drink, such as before driving a car. Needing more and more alcohol to get the same effect you want (building up tolerance). Having symptoms of withdrawal when you stop drinking. Withdrawal symptoms may include: Trouble sleeping, leading to tiredness (fatigue). Mood swings of depression and anxiety. Physical symptoms, such as a fast heart rate, rapid breathing, high blood pressure (hypertension), fever, cold sweats, or  nausea. Seizures. Severe confusion. Feeling or seeing things that are not there (hallucinations). Shaking movements that you cannot control (tremors). How is this diagnosed? This condition is diagnosed with an assessment. Your health care provider may start by asking three or four questions about your drinking, or they may give you a simple test to take. This helps to get clear information from you. You may also have a physical exam or lab tests. You may be referred to a substance abuse counselor. How is this treated? With education, some people with alcohol use disorder are able to reduce their drinking. Many with this disorder cannot change their drinking behavior on their own and need help with treatment from substance use specialists. Treatments may include: Detoxification. Detoxification involves quitting drinking with supervision and direction of health care providers. Your health care provider may prescribe medicines within the first week to help lessen withdrawal symptoms. Alcohol withdrawal can be dangerous and life-threatening. Detoxification may be provided in a home, community, or primary care setting, or in a hospital or substance use treatment facility. Counseling. This may involve motivational interviewing (MI), family therapy, or cognitive behavioral therapy (CBT). A counselor can address the things you can do to change your drinking behavior and how to maintain the changes. Talk therapy aims to: Identify your positive motivations to change. Identify and avoid the things that trigger your drinking. Help you learn how to plan your behavior change. Develop support systems that can help you sustain the change. Medicines. Medicines can help treat this disorder by: Decreasing cravings. Decreasing the positive feeling you have when you drink. Causing an uncomfortable physical reaction when you drink (aversion therapy). Support groups such as Alcoholics Anonymous (AA). These groups are    led by people who have quit drinking. The groups provide emotional support, advice, and guidance. Some people with this condition benefit from a combination of treatments provided by specialized substance use treatment centers. Follow these instructions at home:  Medicines Take over-the-counter and prescription medicines only as told by your health care provider. Ask before starting any new medicines, herbs, or supplements. General instructions Ask friends and family members to support your choice to stay sober. Avoid places where alcohol is served. Create a plan to deal with tempting situations. Attend support groups regularly. Practice hobbies or activities you enjoy. Do not drink and drive. How is this prevented? Do not drink alcohol if your health care provider tells you not to drink. If you drink alcohol: Limit how much you have to: 0-1 drink a day for women who are not pregnant. 0-2 drinks a day for men. Know how much alcohol is in your drink. In the [place] one drink equals one 12 oz bottle of beer (355 mL), one 5 oz glass of wine (148 mL), or one 1 oz glass of hard liquor (44 mL). If you have a mental health condition, seek treatment. Develop a healthy lifestyle through: Meditation or deep breathing. Exercise. Spending time in nature. Listening to music. Talking with a trusted friend or family member. If you are a teen: Do not drink alcohol. Avoid gatherings where you might be tempted to drink alcohol. Do not be afraid to say no if someone offers you alcohol. Speak up about why you do not want to drink. Set a positive example for others around you by not drinking. Build relationships with friends who do not drink. Where to find more information Substance Abuse and Mental Health Services Administration: samhsa.gov Alcoholics Anonymous: aa.org Contact a health care provider if: You cannot take your medicines as told. Your symptoms get worse or you experience symptoms of  withdrawal when you stop drinking. You start drinking again (relapse) and your symptoms get worse. Get help right away if: You have thoughts about hurting yourself or others. Get help right away if you feel like you may hurt yourself or others, or have thoughts about taking your own life. Go to your nearest emergency room or: Call 911. Call the National Suicide Prevention Lifeline at 1-800-273-8255 or 988. This is open 24 hours a day. Text the Crisis Text Line at 741741. Summary Alcohol use disorder is a condition in which drinking disrupts daily life. People with this condition drink too much alcohol and cannot control their drinking. Treatment may include detoxification, counseling, medicines, and support groups. Ask friends and family members to support you. Avoid situations where alcohol is served. Get help right away if you have thoughts about hurting yourself or others. This information is not intended to replace advice given to you by your health care provider. Make sure you discuss any questions you have with your health care provider. Document Revised: 02/09/2022 Document Reviewed: 02/09/2022 Elsevier Patient Education  2023 [employer]vier Inc.  

## 2022-11-21 ENCOUNTER — Ambulatory Visit: Payer: 59 | Admitting: Nurse Practitioner

## 2022-11-30 ENCOUNTER — Ambulatory Visit
Admission: RE | Admit: 2022-11-30 | Discharge: 2022-11-30 | Disposition: A | Discharge status: Home / Self Care | Payer: 59 | Attending: Nurse Practitioner | Admitting: Nurse Practitioner

## 2022-11-30 ENCOUNTER — Ambulatory Visit
Admission: RE | Admit: 2022-11-30 | Discharge: 2022-11-30 | Disposition: A | Discharge status: Home / Self Care | Payer: 59 | Source: Ambulatory Visit | Attending: Nurse Practitioner | Admitting: Nurse Practitioner

## 2022-11-30 ENCOUNTER — Encounter: Payer: Self-pay | Admitting: Nurse Practitioner

## 2022-11-30 ENCOUNTER — Ambulatory Visit (INDEPENDENT_AMBULATORY_CARE_PROVIDER_SITE_OTHER): Payer: 59 | Admitting: Nurse Practitioner

## 2022-11-30 VITALS — BP 118/74 | HR 68 | Temp 97.7°F | Ht 65.98 in | Wt 180.6 lb

## 2022-11-30 DIAGNOSIS — G8929 Other chronic pain: Secondary | ICD-10-CM | POA: Diagnosis not present

## 2022-11-30 DIAGNOSIS — M545 Low back pain, unspecified: Secondary | ICD-10-CM | POA: Insufficient documentation

## 2022-11-30 DIAGNOSIS — F17210 Nicotine dependence, cigarettes, uncomplicated: Secondary | ICD-10-CM | POA: Diagnosis not present

## 2022-11-30 DIAGNOSIS — F1910 Other psychoactive substance abuse, uncomplicated: Secondary | ICD-10-CM

## 2022-11-30 NOTE — Assessment & Plan Note (Signed)
Chronic, ongoing issue.  He is taking Naltrexone with some cut back present, educated him at length on this.  Will continue this at 100 MG daily.  He is motivated to cut back and work on cessation.  Recommend he attend AA meetings to get sponsor for support.  Labs today: will check early morning testosterone due to fatigue.

## 2022-11-30 NOTE — Patient Instructions (Signed)

## 2022-11-30 NOTE — Progress Notes (Signed)
BP 118/74   Pulse 68   Temp 97.7 F (36.5 C) (Oral)   Ht 5' 5.98" (1.676 m)   Wt 180 lb 9.6 oz (81.9 kg)   SpO2 96%   BMI 29.16 kg/m    Subjective:    Patient ID: Earl Baker, male    DOB: August 27, 1966, 56 y.o.   MRN: [id number]  HPI: Earl Baker is a 56 y.o. male  Chief Complaint  Patient presents with   Substance Abuse   Back Pain    Started in July   ALCOHOL ABUSE Follow-up for alcohol use.  Quit for 2 years, was taking Antabuse which was beneficial previous PCP took him off this.  Started on Naltrexone on 10/10/22, has cut back to drinking once a week at this time.  Does not drink daily, but does drink too much when he drinks (12 pack of beer or 5th of liquor).  Hard for him to stop drinking when starts -- all his life has had this issue.  Family history of alcohol [place].  Not going to therapy or AA at this time.  He endorses lack of energy.   Is a current smoker, smokers 1/2 PPD.  Has been smoking since age 89.  Never been a pack per day smoker.  400 pack year smoker.  He is scheduled January 3rd for screening. 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  He thought it would go away, but last two days it has really been hurting.  Pain waxes and wanes.  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 Duration: months Mechanism of injury:  heavy lifting Location: bilateral and low back Onset: gradual Severity: 7/10 Quality: sharp, dull, aching, and throbbing Frequency: intermittent Radiation: none Aggravating factors: lifting, movement, bending, and prolonged sitting Alleviating factors: nothing Status: fluctuating Treatments attempted: none  Relief with NSAIDs?: No NSAIDs Taken Nighttime pain: occasional Paresthesias / decreased sensation:  no Bowel / bladder incontinence:   no Fevers:  no Dysuria / urinary frequency:  no   Relevant past medical, surgical, family and social history reviewed and updated as indicated. Interim medical history since our last visit reviewed. Allergies and medications reviewed and updated.  Review of Systems  Constitutional:  Negative for activity change, diaphoresis, fatigue and fever.  Respiratory:  Negative for cough, chest tightness, shortness of breath and wheezing.   Cardiovascular:  Negative for chest pain, palpitations and leg swelling.  Gastrointestinal: Negative.   Endocrine: Negative for cold intolerance and heat intolerance.  Neurological: Negative.   Psychiatric/Behavioral: Negative.     Per HPI unless specifically indicated above     Objective:    BP 118/74   Pulse 68   Temp 97.7 F (36.5 C) (Oral)   Ht 5' 5.98" (1.676 m)   Wt 180 lb 9.6 oz (81.9 kg)   SpO2 96%   BMI 29.16 kg/m   Wt Readings from Last 3 Encounters:  11/30/22 180 lb 9.6 oz (81.9 kg)  10/10/22 177 lb 12.8 oz (80.6 kg)  01/24/22 174 lb 12.8 oz (79.3 kg)    Physical Exam Vitals and nursing note reviewed.  Constitutional:      General: He is awake. He is not in acute distress.    Appearance: He is well-developed and well-groomed. He is not ill-appearing or toxic-appearing.  HENT:     Head: Normocephalic and atraumatic.     Right Ear: Hearing normal. No drainage.     Left Ear: Hearing normal. No drainage.  Eyes:     General: Lids are normal.        Right eye: No discharge.        Left eye: No discharge.     Conjunctiva/sclera: Conjunctivae normal.     Pupils: Pupils are equal, round, and reactive to light.  Neck:     Thyroid: No thyromegaly.     Vascular: No carotid bruit.  Cardiovascular:     Rate and Rhythm: Normal rate and regular rhythm.     Heart sounds: Normal heart sounds, S1 normal and S2 normal. No murmur heard.    No gallop.  Pulmonary:     Effort: Pulmonary effort is normal. No accessory muscle usage or respiratory  distress.     Breath sounds: Normal breath sounds. No decreased breath sounds, wheezing or rhonchi.  Abdominal:     General: Bowel sounds are normal.     Palpations: Abdomen is soft. There is no hepatomegaly or splenomegaly.  Musculoskeletal:     Cervical back: Normal range of motion and neck supple.     Lumbar back: No swelling, spasms, tenderness or bony tenderness. Decreased range of motion (decreased flexion and extension). Negative right straight leg raise test and negative left straight leg raise test. No scoliosis.     Right lower leg: No edema.     Left lower leg: No edema.     Comments: No rashes.  Lymphadenopathy:     Cervical: No cervical adenopathy.  Skin:    General: Skin is warm and dry.     Capillary Refill: Capillary refill takes less than 2 seconds.  Neurological:     Mental Status: He is alert and oriented to person, place, and time.     Deep Tendon Reflexes: Reflexes are normal and symmetric.  Psychiatric:        Attention and Perception: Attention normal.        Mood and Affect: Mood normal.        Speech: Speech normal.        Behavior: Behavior normal. Behavior is cooperative.        Thought Content: Thought content normal.    Results for orders placed or performed in visit on 10/10/22  T4, free  Result Value Ref Range   Free T4 1.74 0.82 - 1.77 ng/dL  Thyroid peroxidase antibody  Result Value Ref Range   Thyroperoxidase Ab SerPl-aCnc 10 0 - 34 IU/mL  Lipid Panel w/o Chol/HDL Ratio  Result Value Ref Range   Cholesterol, Total 189 100 - 199 mg/dL   Triglycerides 258 (H) 0 - 149 mg/dL   HDL 48 >39 mg/dL   VLDL Cholesterol Cal 44 (H) 5 - 40 mg/dL   LDL Chol Calc (NIH) 97 0 - 99 mg/dL  TSH  Result Value Ref Range   TSH 2.540 0.450 - 4.500 uIU/mL  Comprehensive metabolic panel  Result Value Ref Range   Glucose 102 (H) 70 - 99 mg/dL   BUN 16 6 - 24 mg/dL   Creatinine, Ser 0.84 0.76 - 1.27 mg/dL   eGFR 104 >59 mL/min/1.73   BUN/Creatinine Ratio 19 9 -  20   Sodium 139 134 - 144 mmol/L   Potassium 4.0 3.5 - 5.2 mmol/L   Chloride 104 96 - 106 mmol/L   CO2 17 (L) 20 - 29 mmol/L  Calcium 9.4 8.7 - 10.2 mg/dL   Total Protein 7.0 6.0 - 8.5 g/dL   Albumin 4.5 3.8 - 4.9 g/dL   Globulin, Total 2.5 1.5 - 4.5 g/dL   Albumin/Globulin Ratio 1.8 1.2 - 2.2   Bilirubin Total 0.4 0.0 - 1.2 mg/dL   Alkaline Phosphatase 96 44 - 121 IU/L   AST 25 0 - 40 IU/L   ALT 31 0 - 44 IU/L  Vitamin B12  Result Value Ref Range   Vitamin B-12 613 232 - 1,245 pg/mL      Assessment & Plan:   Problem List Items Addressed This Visit       Other   Chronic bilateral low back pain without sciatica    Ongoing since July after lifting heavy item.  Wax and wane.  Recommend he trial Voltaren gel at home and alternating heat and ice, he prefers no prescription medications.  Provided him with back stretches to perform.  Obtain imaging of lower back.      Relevant Orders   DG Lumbar Spine Complete   Cigarette nicotine dependence without complication    I have recommended complete cessation of tobacco use. I have discussed various options available for assistance with tobacco cessation including over the counter methods (Nicotine gum, patch and lozenges). We also discussed prescription options (Chantix, Nicotine Inhaler / Nasal Spray). The patient is not interested in pursuing any prescription tobacco cessation options at this time.  He is scheduled for initial lung cancer screening, praised for this.       Substance abuse ([place]) - Primary    Chronic, ongoing issue.  He is taking Naltrexone with some cut back present, educated him at length on this.  Will continue this at 100 MG daily.  He is motivated to cut back and work on cessation.  Recommend he attend AA meetings to get sponsor for support.  Labs today: will check early morning testosterone due to fatigue.      Relevant Orders   Testosterone, free, total([facility])     Follow up plan: Return in about 8  weeks (around 01/25/2023) for ALCOHOL ABUSE -- also needs 1st thing in morning lab visit over next week.

## 2022-11-30 NOTE — Assessment & Plan Note (Signed)
Ongoing since July after lifting heavy item.  Wax and wane.  Recommend he trial Voltaren gel at home and alternating heat and ice, he prefers no prescription medications.  Provided him with back stretches to perform.  Obtain imaging of lower back.

## 2022-11-30 NOTE — Assessment & Plan Note (Signed)
I have recommended complete cessation of tobacco use. I have discussed various options available for assistance with tobacco cessation including over the counter methods (Nicotine gum, patch and lozenges). We also discussed prescription options (Chantix, Nicotine Inhaler / Nasal Spray). The patient is not interested in pursuing any prescription tobacco cessation options at this time.  He is scheduled for initial lung cancer screening, praised for this.

## 2022-12-02 NOTE — Progress Notes (Signed)
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  I recommend continue with current plan of care, but this explains your discomfort and you could possibly flare this up at times with unstable movements. Any questions? Keep being amazing!!  Thank you for allowing me to participate in your care.  I appreciate you. Kindest regards, Anayiah Howden

## 2022-12-05 ENCOUNTER — Other Ambulatory Visit: Payer: 59

## 2022-12-05 DIAGNOSIS — F1910 Other psychoactive substance abuse, uncomplicated: Secondary | ICD-10-CM

## 2022-12-06 ENCOUNTER — Other Ambulatory Visit: Payer: Self-pay | Admitting: Nurse Practitioner

## 2022-12-06 ENCOUNTER — Ambulatory Visit: Payer: Self-pay | Admitting: *Deleted

## 2022-12-06 DIAGNOSIS — R7989 Other specified abnormal findings of blood chemistry: Secondary | ICD-10-CM

## 2022-12-06 NOTE — Progress Notes (Signed)
Good morning, please let Barlow know I am still waiting on Free Testosterone level, but total level is on lower end of normal.  I would recommend one more early morning check in 4 weeks to see if this decreases or remains at this level.  Please schedule 4 week lab only visit.  Any questions? Keep being stellar!!  Thank you for allowing me to participate in your care.  I appreciate you. Kindest regards, Ezzard Ditmer

## 2022-12-06 NOTE — Telephone Encounter (Signed)
Pt given x ray results per notes of J. Cannady, NP from 12 /15/23 on 12/06/22. Pt verbalized understanding arthritic changes noted to lower back with disc slipping at L5 on S1 due to the arthritic changes. Patient aware to continue current plan of care. No questions from patient at this time.

## 2022-12-07 ENCOUNTER — Ambulatory Visit: Payer: Self-pay | Admitting: *Deleted

## 2022-12-07 NOTE — Telephone Encounter (Signed)
Pt given lab results per notes of J.Cannady, NP from 12/06/22 on 12/07/22. Pt verbalized understanding.  Lab for testosterone level scheduled for 01/05/23.

## 2022-12-08 LAB — TESTOSTERONE, FREE, TOTAL, SHBG
Sex Hormone Binding: 39.2 nmol/L (ref 19.3–76.4)
Testosterone, Free: 4.6 pg/mL — ABNORMAL LOW (ref 7.2–24.0)
Testosterone: 317 ng/dL (ref 264–916)

## 2022-12-21 ENCOUNTER — Ambulatory Visit (INDEPENDENT_AMBULATORY_CARE_PROVIDER_SITE_OTHER): Payer: 59 | Admitting: Primary Care

## 2022-12-21 DIAGNOSIS — F17210 Nicotine dependence, cigarettes, uncomplicated: Secondary | ICD-10-CM | POA: Diagnosis not present

## 2022-12-21 DIAGNOSIS — F17200 Nicotine dependence, unspecified, uncomplicated: Secondary | ICD-10-CM

## 2022-12-21 NOTE — Patient Instructions (Signed)
Thank you for participating in the [facility]. It was our pleasure to meet you today. We will call you with the results of your scan within the next few days. Your scan will be assigned a Lung RADS category score by the physicians reading the scans.  This Lung RADS score determines follow up scanning.  See below for description of categories, and follow up screening recommendations. We will be in touch to schedule your follow up screening annually or based on recommendations of our providers. We will fax a copy of your scan results to your Primary Care Physician, or the physician who referred you to the program, to ensure they have the results. Please call the office if you have any questions or concerns regarding your scanning experience or results.  Our office number is [phone]. Please speak with Denise Phelps, RN. , or  Denise Buckner RN, They are  our Lung Cancer Screening RN.'s If They are unavailable when you call, Please leave a message on the voice mail. We will return your call at our earliest convenience.This voice mail is monitored several times a day.  Remember, if your scan is normal, we will scan you annually as long as you continue to meet the criteria for the program. (Age 55-77, Current smoker or smoker who has quit within the last 15 years). If you are a smoker, remember, quitting is the single most powerful action that you can take to decrease your risk of lung cancer and other pulmonary, breathing related problems. We know quitting is hard, and we are here to help.  Please let us know if there is anything we can do to help you meet your goal of quitting. If you are a former smoker, congratulations. We are proud of you! Remain smoke free! Remember you can refer friends or family members through the number above.  We will screen them to make sure they meet criteria for the program. Thank you for helping us take better care of you by  participating in Lung Screening.  You can receive free nicotine replacement therapy ( patches, gum or mints) by calling 1-800-QUIT NOW. Please call so we can get you on the path to becoming  a non-smoker. I know it is hard, but you can do this!  Lung RADS Categories:  Lung RADS 1: no nodules or definitely non-concerning nodules.  Recommendation is for a repeat annual scan in 12 months.  Lung RADS 2:  nodules that are non-concerning in appearance and behavior with a very low likelihood of becoming an active cancer. Recommendation is for a repeat annual scan in 12 months.  Lung RADS 3: nodules that are probably non-concerning , includes nodules with a low likelihood of becoming an active cancer.  Recommendation is for a 6-month repeat screening scan. Often noted after an upper respiratory illness. We will be in touch to make sure you have no questions, and to schedule your 6-month scan.  Lung RADS 4 A: nodules with concerning findings, recommendation is most often for a follow up scan in 3 months or additional testing based on our provider's assessment of the scan. We will be in touch to make sure you have no questions and to schedule the recommended 3 month follow up scan.  Lung RADS 4 B:  indicates findings that are concerning. We will be in touch with you to schedule additional diagnostic testing based on our provider's  assessment of the scan.  Other options for assistance in smoking cessation (   As covered by your insurance benefits)  Hypnosis for smoking cessation  [employer] [phone]  Acupuncture for smoking cessation  [facility] [phone]   

## 2022-12-21 NOTE — Progress Notes (Addendum)
Virtual Visit via Telephone Note  I connected with Earl Baker on 01/09/23 at  8:30 AM EST by telephone and verified that I am speaking with the correct person using two identifiers.  Location: Patient: Home Provider: Office   I discussed the limitations, risks, security and privacy concerns of performing an evaluation and management service by telephone and the availability of in person appointments. I also discussed with the patient that there may be a patient responsible charge related to this service. The patient expressed understanding and agreed to proceed.   Shared Decision Making Visit Lung Cancer Screening Program [phone])   Eligibility: Age 57 y.o. Pack Years Smoking History Calculation 23 (# packs/per year x # years smoked) Recent History of coughing up blood  no Unexplained weight loss? no ( >Than 15 pounds within the last 6 months ) Prior History Lung / other cancer no (Diagnosis within the last 5 years already requiring surveillance chest CT Scans). Smoking Status Current Smoker Former Smokers: Years since quit: < 1 year  Quit Date: NA  Visit Components: Discussion included one or more decision making aids. yes Discussion included risk/benefits of screening. yes Discussion included potential follow up diagnostic testing for abnormal scans. yes Discussion included meaning and risk of over diagnosis. yes Discussion included meaning and risk of False Positives. yes Discussion included meaning of total radiation exposure. yes  Counseling Included: Importance of adherence to annual lung cancer LDCT screening. yes Impact of comorbidities on ability to participate in the program. yes Ability and willingness to under diagnostic treatment. yes  Smoking Cessation Counseling: Current Smokers:  Discussed importance of smoking cessation. yes Information about tobacco cessation classes and interventions provided to patient. yes Patient provided with "ticket" for  LDCT Scan. Na Symptomatic Patient. no  Counseling(Intermediate counseling: > three minutes) 99406 Diagnosis Code: Tobacco Use Z72.0 Asymptomatic Patient yes  Counseling (Intermediate counseling: > three minutes counseling) Z6606 Former Smokers:  Discussed the importance of maintaining cigarette abstinence. yes Diagnosis Code: Personal History of Nicotine Dependence. T01.601 Information about tobacco cessation classes and interventions provided to patient. Yes Patient provided with "ticket" for LDCT Scan. Na Written Order for Lung Cancer Screening with LDCT placed in Epic. Yes (CT Chest Lung Cancer Screening Low Dose W/O CM) [id number] Z12.2-Screening of respiratory organs Z87.891-Personal history of nicotine dependence  I have spent 25 minutes of face to face/ virtual visit   time with Mr Stickland discussing the risks and benefits of lung cancer screening. We viewed / discussed a power point together that explained in detail the above noted topics. We paused at intervals to allow for questions to be asked and answered to ensure understanding.We discussed that the single most powerful action that he can take to decrease his risk of developing lung cancer is to quit smoking. We discussed whether or not he is ready to commit to setting a quit date. We discussed options for tools to aid in quitting smoking including nicotine replacement therapy, non-nicotine medications, support groups, Quit Smart classes, and behavior modification. We discussed that often times setting smaller, more achievable goals, such as eliminating 1 cigarette a day for a week and then 2 cigarettes a day for a week can be helpful in slowly decreasing the number of cigarettes smoked. This allows for a sense of accomplishment as well as providing a clinical benefit. I provided  him with smoking cessation  information  with contact information for community resources, classes, free nicotine replacement therapy, and access to mobile  apps, text messaging,  and on-line smoking cessation help. I have also provided him  the office contact information in the event he needs to contact me, or the screening staff. We discussed the time and location of the scan, and that either Doroteo Glassman RN, Joella Prince, RN  or I will call / send a letter with the results within 24-72 hours of receiving them. The patient verbalized understanding of all of  the above and had no further questions upon leaving the office. They have my contact information in the event they have any further questions.  I spent 3 minutes counseling on smoking cessation and the health risks of continued tobacco abuse.  I explained to the patient that there has been a high incidence of coronary artery disease noted on these exams. I explained that this is a non-gated exam therefore degree or severity cannot be determined. This patient is not on statin therapy. I have asked the patient to follow-up with their PCP regarding any incidental finding of coronary artery disease and management with diet or medication as their PCP  feels is clinically indicated. The patient verbalized understanding of the above and had no further questions upon completion of the visit.     Martyn Ehrich, NP

## 2022-12-22 ENCOUNTER — Ambulatory Visit
Admission: RE | Admit: 2022-12-22 | Discharge: 2022-12-22 | Disposition: A | Discharge status: Home / Self Care | Payer: 59 | Source: Ambulatory Visit | Attending: Acute Care | Admitting: Acute Care

## 2022-12-22 DIAGNOSIS — Z122 Encounter for screening for malignant neoplasm of respiratory organs: Secondary | ICD-10-CM | POA: Insufficient documentation

## 2022-12-22 DIAGNOSIS — Z87891 Personal history of nicotine dependence: Secondary | ICD-10-CM | POA: Insufficient documentation

## 2022-12-22 DIAGNOSIS — F17210 Nicotine dependence, cigarettes, uncomplicated: Secondary | ICD-10-CM | POA: Diagnosis present

## 2022-12-23 ENCOUNTER — Telehealth: Payer: Self-pay | Admitting: Acute Care

## 2022-12-23 DIAGNOSIS — R911 Solitary pulmonary nodule: Secondary | ICD-10-CM

## 2022-12-23 DIAGNOSIS — Z87891 Personal history of nicotine dependence: Secondary | ICD-10-CM

## 2022-12-23 NOTE — Telephone Encounter (Signed)
Received call report from [facility] with [place] radiology on CT low dose.  Sarah, please advise. Thank you

## 2022-12-23 NOTE — Telephone Encounter (Signed)
I have called the patient with the results of his low dose CT Chest. I explained that his scan was read as a Lung  RADS 3, nodules that are probably benign findings, short term follow up suggested: includes nodules with a low likelihood of becoming a clinically active cancer. Radiology recommends a 6 month repeat LDCT follow up.  He has a 6 mm nodule that we will re-evaluate for stability In 6 months. He is in agreement with this plan. Langley Gauss, please order 6 month follow up and fax results to PCP with plan of care for 6 month follow up. Thanks so much

## 2022-12-26 ENCOUNTER — Telehealth: Payer: Self-pay | Admitting: Acute Care

## 2022-12-26 ENCOUNTER — Encounter: Payer: Self-pay | Admitting: Nurse Practitioner

## 2022-12-26 DIAGNOSIS — R911 Solitary pulmonary nodule: Secondary | ICD-10-CM | POA: Insufficient documentation

## 2022-12-26 DIAGNOSIS — J432 Centrilobular emphysema: Secondary | ICD-10-CM | POA: Insufficient documentation

## 2022-12-26 NOTE — Telephone Encounter (Signed)
Earl Baker please review following [place] CT results:   Narrative & Impression  CLINICAL DATA:  Current smoker, 23 pack-year history.   EXAM: CT CHEST WITHOUT CONTRAST LOW-DOSE FOR LUNG CANCER SCREENING   TECHNIQUE: Multidetector CT imaging of the chest was performed following the standard protocol without IV contrast.   RADIATION DOSE REDUCTION: This exam was performed according to the departmental dose-optimization program which includes automated exposure control, adjustment of the mA and/or kV according to patient size and/or use of iterative reconstruction technique.   COMPARISON:  None Available.   FINDINGS: Cardiovascular: Heart size normal.  No pericardial effusion.   Mediastinum/Nodes: No pathologically enlarged mediastinal or axillary lymph nodes. Hilar regions are difficult to definitively evaluate without IV contrast. Esophagus is grossly unremarkable.   Lungs/Pleura: Mild centrilobular emphysema. Smoking related respiratory bronchiolitis. 6 mm anterior segment right upper lobe nodule (3/139). No pleural fluid. Airway is unremarkable.   Upper Abdomen: Visualized portions of the liver, gallbladder, adrenal glands, kidneys, spleen, pancreas, stomach and bowel are grossly unremarkable.   Musculoskeletal: Degenerative changes in the spine. No worrisome lytic or sclerotic lesions.   IMPRESSION: 1. 6 mm right upper lobe nodule. Lung-RADS 3, probably benign findings. Short-term follow-up in 6 months is recommended with repeat low-dose chest CT without contrast (please use the following order, "CT CHEST LCS NODULE FOLLOW-UP W/O CM"). These results will be called to the ordering clinician or representative by the Radiologist Assistant, and communication documented in the PACS or [employer]. 2.  Emphysema (ICD10-J43.9).     Electronically Signed   By: Lorin Picket M.D.   On: 12/23/2022 12:03    Thank you

## 2022-12-26 NOTE — Telephone Encounter (Signed)
CT results faxed to PCP with follow up plans included. Order placed for 6 month nodule follow up [facility].

## 2023-01-05 ENCOUNTER — Other Ambulatory Visit: Payer: 59

## 2023-01-22 NOTE — Patient Instructions (Signed)

## 2023-01-25 ENCOUNTER — Ambulatory Visit (INDEPENDENT_AMBULATORY_CARE_PROVIDER_SITE_OTHER): Payer: 59 | Admitting: Nurse Practitioner

## 2023-01-25 ENCOUNTER — Encounter: Payer: Self-pay | Admitting: Nurse Practitioner

## 2023-01-25 VITALS — BP 127/87 | HR 60 | Temp 98.4°F | Ht 65.98 in | Wt 180.8 lb

## 2023-01-25 DIAGNOSIS — G4733 Obstructive sleep apnea (adult) (pediatric): Secondary | ICD-10-CM

## 2023-01-25 DIAGNOSIS — E039 Hypothyroidism, unspecified: Secondary | ICD-10-CM

## 2023-01-25 DIAGNOSIS — Z23 Encounter for immunization: Secondary | ICD-10-CM | POA: Diagnosis not present

## 2023-01-25 DIAGNOSIS — E782 Mixed hyperlipidemia: Secondary | ICD-10-CM | POA: Diagnosis not present

## 2023-01-25 DIAGNOSIS — F1910 Other psychoactive substance abuse, uncomplicated: Secondary | ICD-10-CM | POA: Diagnosis not present

## 2023-01-25 DIAGNOSIS — F17210 Nicotine dependence, cigarettes, uncomplicated: Secondary | ICD-10-CM

## 2023-01-25 DIAGNOSIS — Z0000 Encounter for general adult medical examination without abnormal findings: Secondary | ICD-10-CM | POA: Diagnosis not present

## 2023-01-25 DIAGNOSIS — R7989 Other specified abnormal findings of blood chemistry: Secondary | ICD-10-CM

## 2023-01-25 DIAGNOSIS — J432 Centrilobular emphysema: Secondary | ICD-10-CM | POA: Diagnosis not present

## 2023-01-25 DIAGNOSIS — R911 Solitary pulmonary nodule: Secondary | ICD-10-CM

## 2023-01-25 MED ORDER — ALBUTEROL SULFATE HFA 108 (90 BASE) MCG/ACT IN AERS: 2.0000 | INHALATION_SPRAY | Freq: Four times a day (QID) | RESPIRATORY_TRACT | 2 refills | Status: DC | PRN

## 2023-01-25 NOTE — Assessment & Plan Note (Signed)
Chronic, ongoing.  Check thyroid labs today, TSH and Free T4.  Continue current medication dosage and adjust as needed.

## 2023-01-25 NOTE — Assessment & Plan Note (Signed)
I have recommended complete cessation of tobacco use. I have discussed various options available for assistance with tobacco cessation including over the counter methods (Nicotine gum, patch and lozenges). We also discussed prescription options (Chantix, Nicotine Inhaler / Nasal Spray). The patient is not interested in pursuing any prescription tobacco cessation options at this time.  Lung screening annually.

## 2023-01-25 NOTE — Progress Notes (Signed)
BP 127/87   Pulse 60   Temp 98.4 F (36.9 C) (Oral)   Ht 5' 5.98" (1.676 m)   Wt 180 lb 12.8 oz (82 kg)   SpO2 98%   BMI 29.20 kg/m    Subjective:    Patient ID: Earl Baker, male    DOB: [birth year], 57 y.o.   MRN: [id number]  HPI: Earl Baker is a 57 y.o. male presenting on 01/25/2023 for comprehensive medical examination. Current medical complaints include: follow-up Naltrexone  He currently lives with: self Interim Problems from his last visit: no  The 10-year ASCVD risk score (Arnett DK, et al., 2019) is: 10%   Values used to calculate the score:     Age: 60 years     Sex: Male     Is Non-Hispanic African American: No     Diabetic: No     Tobacco smoker: Yes     Systolic Blood Pressure: 726 mmHg     Is BP treated: No     HDL Cholesterol: 48 mg/dL     Total Cholesterol: 189 mg/dL   COPD Lung screening noted initial on 12/22/22 = mild centrilobular emphysema + a 6 mm right upper lobe nodule to recheck in July.  Currently smokes about one pack per week.  Taking Naltrexone daily for alcohol use reduction -- not attending [employer] yet. He reports noticing some benefit from this, but not 100% -- has curbed it some. COPD status: stable Satisfied with current treatment?: yes Oxygen use: no Dyspnea frequency: occasional with lots of exertion, is working out Cough frequency: none Rescue inhaler frequency:  does not have one Limitation of activity: no Productive cough: none Last Spirometry: unknown Pneumovax: Up to Date Influenza: Up to Date   HYPOTHYROIDISM Continues on Levothyroxine 75 MCG. Thyroid control status:stable Satisfied with current treatment? yes Medication side effects: no Medication compliance: good compliance Etiology of hypothyroidism: unknown Recent dose adjustment:no Fatigue: no Cold intolerance: no Heat intolerance: no Weight gain: no Weight loss: no Constipation: no Diarrhea/loose stools: no Palpitations: no Lower extremity  edema: no Anxiety/depressed mood: no   Functional Status Survey: Is the patient deaf or have difficulty hearing?: No Does the patient have difficulty seeing, even when wearing glasses/contacts?: No Does the patient have difficulty concentrating, remembering, or making decisions?: No Does the patient have difficulty walking or climbing stairs?: No Does the patient have difficulty dressing or bathing?: No Does the patient have difficulty doing errands alone such as visiting a doctor's office or shopping?: No     07/14/2021    9:30 AM 01/24/2022    9:26 AM 10/10/2022    9:14 AM 11/30/2022    3:30 PM 01/25/2023    9:26 AM  Fall Risk  Falls in the past year? 0 0 0 0 0  Was there an injury with Fall? 0 0 0 0 0  Fall Risk Category Calculator 0 0 0 0 0  Fall Risk Category (Retired) Low Low Low Low   Patient at Risk for Falls Due to No Fall Risks No Fall Risks No Fall Risks No Fall Risks No Fall Risks  Fall risk Follow up   Falls evaluation completed Falls evaluation completed Falls prevention discussed         01/25/2023    9:04 AM 11/30/2022    3:30 PM 10/10/2022    9:15 AM 01/24/2022    9:27 AM 07/14/2021    9:30 AM  Depression screen PHQ 2/9  Decreased Interest 2 1 1  1 0  Down, Depressed, Hopeless '2 1 1 '$ 0 0  PHQ - 2 Score '4 2 2 1 '$ 0  Altered sleeping '2 1 1 2   '$ Tired, decreased energy '2 3 2 1   '$ Change in appetite '2 1 2 1   '$ Feeling bad or failure about yourself  '1 1 1 '$ 0   Trouble concentrating 1 0 1 0   Moving slowly or fidgety/restless 0 0 0 0   Suicidal thoughts 0 0 0 0   PHQ-9 Score '12 8 9 5   '$ Difficult doing work/chores Somewhat difficult Somewhat difficult Somewhat difficult Not difficult at all        01/25/2023    9:04 AM 11/30/2022    3:31 PM 10/10/2022    9:17 AM 01/24/2022    9:27 AM  GAD 7 : Generalized Anxiety Score  Nervous, Anxious, on Edge 1 0 0 0  Control/stop worrying 1 1 0 0  Worry too much - different things '1 1 1 '$ 0  Trouble relaxing 0 1 0 0  Restless 0 0 0 0   Easily annoyed or irritable 1 1 0 0  Afraid - awful might happen 1 1 0 0  Total GAD 7 Score '5 5 1 '$ 0  Anxiety Difficulty Somewhat difficult Somewhat difficult Not difficult at all Not difficult at all   Advanced Directives Does patient have a HCPOA?    no If yes, name and contact information:  Does patient have a living will or MOST form?  no  Past Medical History:  Past Medical History:  Diagnosis Date   Alcoholism /alcohol abuse 09/22/2015   Arthritis    Hep C w/o coma, chronic (HCC)    Treated.  Pre 2010.   Hyperlipidemia    Hypothyroidism    Low back pain    Olecranon bursitis 07/24/2019   Substance abuse ([place])     Surgical History:  Past Surgical History:  Procedure Laterality Date   APPENDECTOMY     COLONOSCOPY WITH PROPOFOL N/A 01/03/2019   Procedure: COLONOSCOPY WITH PROPOFOL;  Surgeon: Lucilla Lame, MD;  Location: [facility];  Service: Endoscopy;  Laterality: N/A;   POLYPECTOMY  01/03/2019   Procedure: POLYPECTOMY;  Surgeon: Lucilla Lame, MD;  Location: [facility];  Service: Endoscopy;;    Medications:  Current Outpatient Medications on File Prior to Visit  Medication Sig   levothyroxine (SYNTHROID) 75 MCG tablet TAKE 1 TABLET (75 MCG TOTAL) BY MOUTH DAILY BEFORE BREAKFAST. NEED OFFICE VISIT FOR FURTHER REFILLS.   Multiple Vitamins-Minerals (MULTIVITAMIN ADULT PO) Take by mouth.   naltrexone (DEPADE) 50 MG tablet Start out with 50 mg once daily by mouth; may increase to 100 mg (two tablets) once daily after 1 week.   Omega-3 Fatty Acids (FISH OIL PO) Take by mouth.   No current facility-administered medications on file prior to visit.    Allergies:  No Known Allergies  Social History:  Social History   Socioeconomic History   Marital status: Single    Spouse name: Not on file   Number of children: Not on file   Years of education: Not on file   Highest education level: Not on file  Occupational History   Not on file  Tobacco Use    Smoking status: Every Day    Packs/day: 0.25    Years: 30.00    Total pack years: 7.50    Types: Cigarettes, E-cigarettes   Smokeless tobacco: Never  Vaping Use   Vaping Use: Every  day   Substances: Nicotine, Flavoring  Substance and Sexual Activity   Alcohol use: Not Currently    Alcohol/week: 36.0 standard drinks of alcohol    Types: 36 Cans of beer per week    Comment: pt reports quit a year ago    Drug use: Not Currently    Types: Cocaine    Comment: pt reports quit    Sexual activity: Not on file  Other Topics Concern   Not on file  Social History Narrative   Not on file   Social Determinants of Health   Financial Resource Strain: Not on file  Food Insecurity: Not on file  Transportation Needs: Not on file  Physical Activity: Not on file  Stress: Not on file  Social Connections: Not on file  Intimate Partner Violence: Not on file   Social History   Tobacco Use  Smoking Status Every Day   Packs/day: 0.25   Years: 30.00   Total pack years: 7.50   Types: Cigarettes, E-cigarettes  Smokeless Tobacco Never   Social History   Substance and Sexual Activity  Alcohol Use Not Currently   Alcohol/week: 36.0 standard drinks of alcohol   Types: 36 Cans of beer per week   Comment: pt reports quit a year ago     Family History:  Family History  Problem Relation Age of Onset   Hyperlipidemia Father    Cancer Father        prostate   Alcohol abuse Brother     Past medical history, surgical history, medications, allergies, family history and social history reviewed with patient today and changes made to appropriate areas of the chart.   ROS All other ROS negative except what is listed above and in the HPI.      Objective:    BP 127/87   Pulse 60   Temp 98.4 F (36.9 C) (Oral)   Ht 5' 5.98" (1.676 m)   Wt 180 lb 12.8 oz (82 kg)   SpO2 98%   BMI 29.20 kg/m   Wt Readings from Last 3 Encounters:  01/25/23 180 lb 12.8 oz (82 kg)  11/30/22 180 lb 9.6 oz  (81.9 kg)  10/10/22 177 lb 12.8 oz (80.6 kg)    Physical Exam Vitals and nursing note reviewed.  Constitutional:      General: He is awake. He is not in acute distress.    Appearance: He is well-developed and well-groomed. He is not ill-appearing or toxic-appearing.  HENT:     Head: Normocephalic and atraumatic.     Right Ear: Hearing, tympanic membrane, ear canal and external ear normal. No drainage.     Left Ear: Hearing, tympanic membrane, ear canal and external ear normal. No drainage.     Nose: Nose normal.     Mouth/Throat:     Pharynx: Uvula midline.  Eyes:     General: Lids are normal.        Right eye: No discharge.        Left eye: No discharge.     Extraocular Movements: Extraocular movements intact.     Conjunctiva/sclera: Conjunctivae normal.     Pupils: Pupils are equal, round, and reactive to light.     Visual Fields: Right eye visual fields normal and left eye visual fields normal.  Neck:     Thyroid: No thyromegaly.     Vascular: No carotid bruit or JVD.     Trachea: Trachea normal.  Cardiovascular:     Rate and Rhythm: Normal  rate and regular rhythm.     Heart sounds: Normal heart sounds, S1 normal and S2 normal. No murmur heard.    No gallop.  Pulmonary:     Effort: Pulmonary effort is normal. No accessory muscle usage or respiratory distress.     Breath sounds: Normal breath sounds.  Abdominal:     General: Bowel sounds are normal.     Palpations: Abdomen is soft. There is no hepatomegaly or splenomegaly.     Tenderness: There is no abdominal tenderness.  Musculoskeletal:        General: Normal range of motion.     Cervical back: Normal range of motion and neck supple.     Right lower leg: No edema.     Left lower leg: No edema.  Lymphadenopathy:     Head:     Right side of head: No submental, submandibular, tonsillar, preauricular or posterior auricular adenopathy.     Left side of head: No submental, submandibular, tonsillar, preauricular or  posterior auricular adenopathy.     Cervical: No cervical adenopathy.  Skin:    General: Skin is warm and dry.     Capillary Refill: Capillary refill takes less than 2 seconds.     Findings: No rash.  Neurological:     Mental Status: He is alert and oriented to person, place, and time.     Gait: Gait is intact.     Deep Tendon Reflexes: Reflexes are normal and symmetric.     Reflex Scores:      Brachioradialis reflexes are 2+ on the right side and 2+ on the left side.      Patellar reflexes are 2+ on the right side and 2+ on the left side. Psychiatric:        Attention and Perception: Attention normal.        Mood and Affect: Mood normal.        Speech: Speech normal.        Behavior: Behavior normal. Behavior is cooperative.        Thought Content: Thought content normal.        Cognition and Memory: Cognition normal.    Results for orders placed or performed in visit on 12/05/22  Testosterone, free, total([facility])  Result Value Ref Range   Testosterone 317 264 - 916 ng/dL   Testosterone, Free 4.6 (L) 7.2 - 24.0 pg/mL   Sex Hormone Binding 39.2 19.3 - 76.4 nmol/L      Assessment & Plan:   Problem List Items Addressed This Visit       Respiratory   Centrilobular emphysema ([place]) - Primary    Ongoing, noted on initial lung screening 12/23/22 == educated patient on this finding and nodule.  Will send in Albuterol inhaler to use as needed, discussed with him.  Spirometry next visit and annually.  Return in 6 months.      Relevant Medications   albuterol (VENTOLIN HFA) 108 (90 Base) MCG/ACT inhaler   Other Relevant Orders   CBC with Differential/Platelet   Lung nodule    Is to repeat CT in 6 months, July 2024, for recheck.        Endocrine   Hypothyroidism    Chronic, ongoing.  Check thyroid labs today, TSH and Free T4.  Continue current medication dosage and adjust as needed.      Relevant Orders   T4, free   TSH     Other   Cigarette nicotine dependence  without complication    I  have recommended complete cessation of tobacco use. I have discussed various options available for assistance with tobacco cessation including over the counter methods (Nicotine gum, patch and lozenges). We also discussed prescription options (Chantix, Nicotine Inhaler / Nasal Spray). The patient is not interested in pursuing any prescription tobacco cessation options at this time.  Lung screening annually.       Hyperlipidemia    Chronic, ongoing with ASCVD 10%.  No current medications, not interested.  Will continue to discuss at visits and recommend. Lipid panel today.      Relevant Orders   Comprehensive metabolic panel   Lipid Panel w/o Chol/HDL Ratio   Substance abuse (HCC)    Chronic, ongoing issue.  He is taking Naltrexone with some cut back present, educated him at length on this.  Will continue this at 100 MG daily.  He is motivated to cut back and work on cessation.  Recommend he attend AA meetings to get sponsor for support.  Labs today: will check early morning testosterone repeat to assess.      Relevant Orders   Comprehensive metabolic panel   Other Visit Diagnoses     Low testosterone level in male       Recheck today and send to urology as needed.   Relevant Orders   PSA   Testosterone, free, total([facility])   Need for shingles vaccine       Shingrix # 2 today.   Relevant Orders   Zoster Recombinant (Shingrix ) (Completed)   Encounter for annual physical exam       Annual physical today with labs and health maintenance reviewed, discussed with patient.        LABORATORY TESTING:  Health maintenance labs ordered today as discussed above.   The natural history of prostate cancer and ongoing controversy regarding screening and potential treatment outcomes of prostate cancer has been discussed with the patient. The meaning of a false positive PSA and a false negative PSA has been discussed. He indicates understanding of the  limitations of this screening test and wishes to proceed with screening PSA testing.   IMMUNIZATIONS:   - Tdap: Tetanus vaccination status reviewed: last tetanus booster within 10 years. - Influenza: Up to date - Pneumovax: Up to date - Prevnar: Not applicable - Zostavax vaccine: has one done  SCREENING: - Colonoscopy: Up to date  Discussed with patient purpose of the colonoscopy is to detect colon cancer at curable precancerous or early stages   - AAA Screening: Not applicable  -Hearing Test: Not applicable  -Spirometry: will obtain next visit   PATIENT COUNSELING:    Sexuality: Discussed sexually transmitted diseases, partner selection, use of condoms, avoidance of unintended pregnancy  and contraceptive alternatives.   Advised to avoid cigarette smoking.  I discussed with the patient that most people either abstain from alcohol or drink within safe limits (<=14/week and <=4 drinks/occasion for males, <=7/weeks and <= 3 drinks/occasion for females) and that the risk for alcohol disorders and other health effects rises proportionally with the number of drinks per week and how often a drinker exceeds daily limits.  Discussed cessation/primary prevention of drug use and availability of treatment for abuse.   Diet: Encouraged to adjust caloric intake to maintain  or achieve ideal body weight, to reduce intake of dietary saturated fat and total fat, to limit sodium intake by avoiding high sodium foods and not adding table salt, and to maintain adequate dietary potassium and calcium preferably from fresh fruits, vegetables, and low-fat  dairy products.    Stressed the importance of regular exercise  Injury prevention: Discussed safety belts, safety helmets, smoke detector, smoking near bedding or upholstery.   Dental health: Discussed importance of regular tooth brushing, flossing, and dental visits.   Follow up plan: NEXT PREVENTATIVE PHYSICAL DUE IN 1 YEAR. Return in about 6 months  (around 07/26/2023) for COPD, HYPOTHYROID -- spirometry.

## 2023-01-25 NOTE — Assessment & Plan Note (Signed)
Ongoing, noted on initial lung screening 12/23/22 == educated patient on this finding and nodule.  Will send in Albuterol inhaler to use as needed, discussed with him.  Spirometry next visit and annually.  Return in 6 months.

## 2023-01-25 NOTE — Assessment & Plan Note (Signed)
Chronic, ongoing issue.  He is taking Naltrexone with some cut back present, educated him at length on this.  Will continue this at 100 MG daily.  He is motivated to cut back and work on cessation.  Recommend he attend AA meetings to get sponsor for support.  Labs today: will check early morning testosterone repeat to assess.

## 2023-01-25 NOTE — Assessment & Plan Note (Signed)
Chronic, ongoing with ASCVD 10%.  No current medications, not interested.  Will continue to discuss at visits and recommend. Lipid panel today.

## 2023-01-25 NOTE — Assessment & Plan Note (Signed)
Is to repeat CT in 6 months, July 2024, for recheck.

## 2023-01-26 ENCOUNTER — Other Ambulatory Visit: Payer: Self-pay | Admitting: Nurse Practitioner

## 2023-01-26 ENCOUNTER — Telehealth: Payer: Self-pay | Admitting: *Deleted

## 2023-01-26 MED ORDER — ROSUVASTATIN CALCIUM 10 MG PO TABS: 10.0000 mg | ORAL_TABLET | Freq: Every day | ORAL | 4 refills | Status: DC

## 2023-01-26 NOTE — Telephone Encounter (Signed)
Patient called for results and has been notified: Contacted via [place], but may not check so please call: Good afternoon Zeki, labs are returning: - Cholesterol labs are elevated, placing you at higher risk for stroke or heart attack.  I do recommend we start a low dose of Rosuvastatin 10 MG  which I will send in to help lower levels and for prevention.  Please let me know how you do with this.  We will recheck fasting labs next visit. - Thyroid labs are normal. - Prostate blood work is normal and testosterone levels remain in normal range, on lower side.  Treatment would not be recommended for this. - CBC shows no anemia or infection.  CMP shows normal kidney and liver function. Any questions? Keep being amazing!!  Thank you for allowing me to participate in your care.  I appreciate you. Kindest regards, Jolene  Patient states he expected the numbers to be elevated- he has not been eating properly. Patient agrees to take medication for ow- but states it is not something he wants to continue

## 2023-01-26 NOTE — Progress Notes (Signed)
Contacted via [place], but may not check so please call: Good afternoon Earl Baker, labs are returning: - Cholesterol labs are elevated, placing you at higher risk for stroke or heart attack.  I do recommend we start a low dose of Rosuvastatin 10 MG  which I will send in to help lower levels and for prevention.  Please let me know how you do with this.  We will recheck fasting labs next visit. - Thyroid labs are normal. - Prostate blood work is normal and testosterone levels remain in normal range, on lower side.  Treatment would not be recommended for this. - CBC shows no anemia or infection.  CMP shows normal kidney and liver function. Any questions? Keep being amazing!!  Thank you for allowing me to participate in your care.  I appreciate you. Kindest regards, Camren Lipsett

## 2023-02-03 LAB — COMPREHENSIVE METABOLIC PANEL
ALT: 29 IU/L (ref 0–44)
AST: 25 IU/L (ref 0–40)
Albumin/Globulin Ratio: 2 (ref 1.2–2.2)
Albumin: 4.7 g/dL (ref 3.8–4.9)
Alkaline Phosphatase: 87 IU/L (ref 44–121)
BUN/Creatinine Ratio: 17 (ref 9–20)
BUN: 14 mg/dL (ref 6–24)
Bilirubin Total: 0.8 mg/dL (ref 0.0–1.2)
CO2: 21 mmol/L (ref 20–29)
Calcium: 9.4 mg/dL (ref 8.7–10.2)
Chloride: 100 mmol/L (ref 96–106)
Creatinine, Ser: 0.83 mg/dL (ref 0.76–1.27)
Globulin, Total: 2.3 g/dL (ref 1.5–4.5)
Glucose: 92 mg/dL (ref 70–99)
Potassium: 3.7 mmol/L (ref 3.5–5.2)
Sodium: 136 mmol/L (ref 134–144)
Total Protein: 7 g/dL (ref 6.0–8.5)
eGFR: 103 mL/min/{1.73_m2} (ref >59)

## 2023-02-03 LAB — TESTOSTERONE, FREE, TOTAL, SHBG
Sex Hormone Binding: 52.4 nmol/L (ref 19.3–76.4)
Testosterone, Free: 5.3 pg/mL — ABNORMAL LOW (ref 7.2–24.0)
Testosterone: 375 ng/dL (ref 264–916)

## 2023-02-03 LAB — CBC WITH DIFFERENTIAL/PLATELET
Basophils Absolute: 0.1 10*3/uL (ref 0.0–0.2)
Basos: 1 %
EOS (ABSOLUTE): 0.2 10*3/uL (ref 0.0–0.4)
Eos: 3 %
Hematocrit: 45.4 % (ref 37.5–51.0)
Hemoglobin: 15.2 g/dL (ref 13.0–17.7)
Immature Grans (Abs): 0 10*3/uL (ref 0.0–0.1)
Immature Granulocytes: 0 %
Lymphocytes Absolute: 2.8 10*3/uL (ref 0.7–3.1)
Lymphs: 38 %
MCH: 29 pg (ref 26.6–33.0)
MCHC: 33.5 g/dL (ref 31.5–35.7)
MCV: 87 fL (ref 79–97)
Monocytes Absolute: 0.7 10*3/uL (ref 0.1–0.9)
Monocytes: 10 %
Neutrophils Absolute: 3.6 10*3/uL (ref 1.4–7.0)
Neutrophils: 48 %
Platelets: 232 10*3/uL (ref 150–450)
RBC: 5.25 x10E6/uL (ref 4.14–5.80)
RDW: 13.4 % (ref 11.6–15.4)
WBC: 7.4 10*3/uL (ref 3.4–10.8)

## 2023-02-03 LAB — LIPID PANEL W/O CHOL/HDL RATIO
Cholesterol, Total: 226 mg/dL — ABNORMAL HIGH (ref 100–199)
HDL: 46 mg/dL (ref >39)
LDL Chol Calc (NIH): 150 mg/dL — ABNORMAL HIGH (ref 0–99)
Triglycerides: 164 mg/dL — ABNORMAL HIGH (ref 0–149)
VLDL Cholesterol Cal: 30 mg/dL (ref 5–40)

## 2023-02-03 LAB — SPECIMEN STATUS REPORT

## 2023-02-03 LAB — TSH: TSH: 2.94 u[IU]/mL (ref 0.450–4.500)

## 2023-02-03 LAB — T4, FREE: Free T4: 1.46 ng/dL (ref 0.82–1.77)

## 2023-02-03 LAB — PSA: Prostate Specific Ag, Serum: 0.8 ng/mL (ref 0.0–4.0)

## 2023-02-04 ENCOUNTER — Other Ambulatory Visit: Payer: Self-pay | Admitting: Nurse Practitioner

## 2023-02-04 DIAGNOSIS — E039 Hypothyroidism, unspecified: Secondary | ICD-10-CM

## 2023-02-06 NOTE — Telephone Encounter (Signed)
Requested Prescriptions  Pending Prescriptions Disp Refills   levothyroxine (SYNTHROID) 75 MCG tablet [Pharmacy Med Name: LEVOTHYROXINE 75 MCG TABLET] 90 tablet 3    Sig: TAKE 1 TABLET (75 MCG TOTAL) BY MOUTH DAILY BEFORE BREAKFAST. NEED OFFICE VISIT FOR FURTHER REFILLS.     Endocrinology:  Hypothyroid Agents Passed - 02/04/2023  8:37 AM      Passed - TSH in normal range and within 360 days    TSH  Date Value Ref Range Status  01/25/2023 2.940 0.450 - 4.500 uIU/mL Final         Passed - Valid encounter within last 12 months    Recent Outpatient Visits           1 week ago Centrilobular emphysema ([place])   [place], Henrine Screws T, NP   2 months ago Substance abuse ([place])   [place], Henrine Screws T, NP   3 months ago Substance abuse ([place])   [place], Henrine Screws T, NP   1 year ago Chronic pain of left knee   [facility] [place], Avanti, MD   1 year ago Strep pharyngitis   [place] [place], Scheryl Darter, NP       Future Appointments             In 5 months Cannady, Barbaraann Faster, NP [facility]

## 2023-06-23 ENCOUNTER — Ambulatory Visit
Admission: RE | Admit: 2023-06-23 | Discharge: 2023-06-23 | Disposition: A | Discharge status: Home / Self Care | Payer: 59 | Source: Ambulatory Visit | Attending: Acute Care | Admitting: Acute Care

## 2023-06-23 DIAGNOSIS — R911 Solitary pulmonary nodule: Secondary | ICD-10-CM | POA: Insufficient documentation

## 2023-06-23 DIAGNOSIS — Z87891 Personal history of nicotine dependence: Secondary | ICD-10-CM | POA: Diagnosis present

## 2023-06-27 ENCOUNTER — Other Ambulatory Visit: Payer: Self-pay | Admitting: Acute Care

## 2023-06-27 DIAGNOSIS — Z87891 Personal history of nicotine dependence: Secondary | ICD-10-CM

## 2023-06-27 DIAGNOSIS — Z122 Encounter for screening for malignant neoplasm of respiratory organs: Secondary | ICD-10-CM

## 2023-07-05 ENCOUNTER — Telehealth: Payer: Self-pay | Admitting: Nurse Practitioner

## 2023-07-05 NOTE — Telephone Encounter (Signed)
Patient called to get the results from his ling CT done on 7/5 but it has not be reviewed by provider. Please f/u with patient.

## 2023-07-10 NOTE — Telephone Encounter (Signed)
Pt is calling to receive CT imaging results Please advise CB- [phone]

## 2023-07-11 ENCOUNTER — Telehealth: Payer: Self-pay | Admitting: Acute Care

## 2023-07-11 NOTE — Telephone Encounter (Signed)
Pls call PT w/CT results. Thanks.  [phone]

## 2023-07-11 NOTE — Telephone Encounter (Signed)
Spoke with patient and informed him that he would have to call [facility] and ask there for results since Kandice Robinsons NP is the one who ordered the CT. Patient verbalized understanding

## 2023-07-12 NOTE — Telephone Encounter (Signed)
Called and spoke to patient. Patient states he was originally calling for the LDCT results from 06/23/2023. Patient states since calling in he has been able to log into MyChart and view his results. Patient did not have any questions at the time of the call. Repeat order already placed for 12 months. Pt verbalized understanding and denied any further questions or concerns at this time.

## 2023-07-20 NOTE — Patient Instructions (Signed)
Eating Plan for Chronic Obstructive Pulmonary Disease Chronic obstructive pulmonary disease (COPD) causes symptoms such as shortness of breath, coughing, and chest discomfort. These symptoms can make it difficult to eat enough to maintain a healthy weight. Generally, people with COPD should eat a diet that is high in calories, protein, and other nutrients to maintain body weight and to keep the lungs as healthy as possible. Depending on the medicines you take and other health conditions you may have, your health care provider may give you additional recommendations on what to eat or avoid. Talk with your health care provider about your goals for body weight, and work with a dietitian to develop an eating plan that is right for you. What are tips for following this plan? Reading food labels  Avoid foods with more than 300 milligrams (mg) of salt (sodium) per serving. Choose foods that contain at least 4 grams (g) of fiber per serving. Try to eat 20-30 g of fiber each day. Choose foods that are high in calories and protein, such as nuts, beans, yogurt, and cheese. Shopping Do not buy foods labeled as diet, low-calorie, or low-fat. If you are able to eat dairy products: Avoid low-fat or skim milk. Buy dairy products that have at least 2% fat. Buy nutritional supplement drinks. Buy grains and prepared foods labeled as enriched or fortified. Consider buying low-sodium, pre-made foods to conserve energy for eating. Cooking Add dry milk or protein powder to smoothies. Cook with healthy fats, such as olive oil, canola oil, sunflower oil, and grapeseed oil. Add oil, butter, cream cheese, or nut butters to foods to increase fat and calories. To make foods easier to chew and swallow: Cook vegetables, pasta, and rice until soft. Cut or grind meat into very small pieces. Dip breads in liquid. Meal planning  Eat when you feel hungry. Eat 5-6 small meals throughout the day. Drink 6-8 glasses of water  each day. Do not drink liquids with meals. Drink liquids at the end of the meal to avoid feeling full too quickly. Eat a variety of fruits and vegetables every day. Ask for assistance from family or friends with planning and preparing meals as needed. Avoid foods that cause you to feel bloated, such as carbonated drinks, fried foods, beans, broccoli, cabbage, and apples. For older adults, ask your local agency on aging whether you are eligible for meal assistance programs, such as Meals on Wheels. Lifestyle  Do not smoke. Eat slowly. Take small bites and chew food well before swallowing. Do not overeat. This may make it more difficult to breathe after eating. Sit up while eating. If needed, continue to use supplemental oxygen while eating. Rest or relax for 30 minutes before and after eating. Monitor your weight as told by your health care provider. Exercise as told by your health care provider. What foods should I eat? Fruits All fresh, dried, canned, or frozen fruits that do not cause gas. Vegetables All fresh, canned (no salt added), or frozen vegetables that do not cause gas. Grains Whole-grain bread. Enriched whole-grain pasta. Fortified whole-grain cereals. Fortified rice. Quinoa. Meats and other proteins Lean meat. Poultry. Fish. Dried beans. Unsalted nuts. Tofu. Eggs. Nut butters. Dairy Whole or 2% milk. Cheese. Yogurt. Fats and oils Olive oil. Canola oil. Butter. Margarine. Beverages Water. Vegetable juice (no salt added). Decaffeinated coffee. Decaffeinated or herbal tea. Seasonings and condiments Fresh or dried herbs. Low-salt or salt-free seasonings. Low-sodium soy sauce. The items listed above may not be a complete list of foods   and beverages you can eat. Contact a dietitian for more information. What foods should I avoid? Fruits Fruits that cause gas, such as apples or melon. Vegetables Vegetables that cause gas, such as broccoli, Brussels sprouts, cabbage,  cauliflower, and onions. Canned vegetables with added salt. Meats and other proteins Fried meat. Salt-cured meat. Processed meat. Dairy Fat-free or low-fat milk, yogurt, or cheese. Processed cheese. Beverages Carbonated drinks. Caffeinated drinks, such as coffee, tea, and soft drinks. Juice. Alcohol. Vegetable juice with added salt. Seasonings and condiments Salt. Seasoning mixes with salt. Soy sauce. Pickles. Other foods Clear soup or broth. Fried foods. Prepared frozen meals. The items listed above may not be a complete list of foods and beverages you should avoid. Contact a dietitian for more information. Summary COPD symptoms can make it difficult to eat enough to maintain a healthy weight. A COPD eating plan can help you maintain your body weight and keep your lungs as healthy as possible. Eat a diet that is high in calories, protein, and other nutrients. Read labels to make sure that you are getting the right nutrients. Cook foods to make them easier to chew and swallow. Eat 5-6 small meals throughout the day, and avoid foods that cause gas or make you feel bloated. This information is not intended to replace advice given to you by your health care provider. Make sure you discuss any questions you have with your health care provider. Document Revised: 10/13/2020 Document Reviewed: 10/13/2020 Elsevier Patient Education  2024 [employer]  

## 2023-07-26 ENCOUNTER — Encounter: Payer: Self-pay | Admitting: Nurse Practitioner

## 2023-07-26 ENCOUNTER — Ambulatory Visit (INDEPENDENT_AMBULATORY_CARE_PROVIDER_SITE_OTHER): Payer: 59 | Admitting: Nurse Practitioner

## 2023-07-26 VITALS — BP 114/73 | HR 62 | Temp 97.9°F | Ht 66.0 in | Wt 183.8 lb

## 2023-07-26 DIAGNOSIS — F1910 Other psychoactive substance abuse, uncomplicated: Secondary | ICD-10-CM

## 2023-07-26 DIAGNOSIS — J432 Centrilobular emphysema: Secondary | ICD-10-CM

## 2023-07-26 DIAGNOSIS — F17210 Nicotine dependence, cigarettes, uncomplicated: Secondary | ICD-10-CM

## 2023-07-26 DIAGNOSIS — G4733 Obstructive sleep apnea (adult) (pediatric): Secondary | ICD-10-CM

## 2023-07-26 DIAGNOSIS — F320 Major depressive disorder, single episode, mild: Secondary | ICD-10-CM | POA: Insufficient documentation

## 2023-07-26 DIAGNOSIS — E782 Mixed hyperlipidemia: Secondary | ICD-10-CM

## 2023-07-26 DIAGNOSIS — E039 Hypothyroidism, unspecified: Secondary | ICD-10-CM

## 2023-07-26 MED ORDER — GABAPENTIN 600 MG PO TABS: ORAL_TABLET | ORAL | 2 refills | Status: DC

## 2023-07-26 NOTE — Assessment & Plan Note (Signed)
Ongoing, noted on initial lung screening 12/23/22 == educated patient on this finding and nodule, which remains stable on July 2024 recheck. Has Albuterol inhaler to use as needed, discussed with him.  Spirometry next visit and annually.

## 2023-07-26 NOTE — Progress Notes (Signed)
BP 114/73   Pulse 62   Temp 97.9 F (36.6 C) (Oral)   Ht 5\' 6"  (1.676 m)   Wt 183 lb 12.8 oz (83.4 kg)   SpO2 98%   BMI 29.67 kg/m    Subjective:    Patient ID: Earl Baker, male    DOB: 03/14/1966, 57 y.o.   MRN: [id number]  HPI: Earl Baker is a 57 y.o. male  Chief Complaint  Patient presents with   Hypothyroidism   Hyperlipidemia   HYPERLIPIDEMIA & ALCOHOL ABUSE Rosuvastatin 10 MG started in February.  We tried Naltrexone for alcohol use, but this did not offer benefit to reducing alcohol use.  Currently is 2 cases of beer at week.  A few months ago started to use cocaine nasally, only when he drinks.  Does not drink alcohol every day.  Currently drinks 2 days a week, binges those days.  On the days he works there is no alcohol or cocaine use.  He has history of cocaine use in past -- about 3 years ago.    In past went to [employer], but has never stuck with it.  Tried rehab, but was kicked out as insurance stopped paying.  Has also been to therapist.  His mother and father have heavy alcohol use -- brothers have this as well, but they have quit.  Sister does not have any use. Hyperlipidemia status: good compliance Satisfied with current treatment?  yes Side effects:  no Medication compliance: good compliance Supplements: none Aspirin:  no The 10-year ASCVD risk score (Arnett DK, et al., 2019) is: 10.5%   Values used to calculate the score:     Age: 31 years     Sex: Male     Is Non-Hispanic African American: No     Diabetic: No     Tobacco smoker: Yes     Systolic Blood Pressure: 114 mmHg     Is BP treated: No     HDL Cholesterol: 46 mg/dL     Total Cholesterol: 226 mg/dL Chest pain:  no Coronary artery disease:  no Family history CAD:  no Family history early CAD:  no  Flowsheet Row Office Visit from 07/26/2023 in [facility]  Alcohol Use Disorder Identification Test Final Score (AUDIT) 23          07/26/2023    9:05  AM 01/25/2023    9:04 AM 11/30/2022    3:30 PM 10/10/2022    9:15 AM 01/24/2022    9:27 AM  Depression screen PHQ 2/9  Decreased Interest 2 2 1 1 1   Down, Depressed, Hopeless 2 2 1 1  0  PHQ - 2 Score 4 4 2 2 1   Altered sleeping 1 2 1 1 2   Tired, decreased energy 3 2 3 2 1   Change in appetite 2 2 1 2 1   Feeling bad or failure about yourself  1 1 1 1  0  Trouble concentrating 0 1 0 1 0  Moving slowly or fidgety/restless 0 0 0 0 0  Suicidal thoughts 0 0 0 0 0  PHQ-9 Score 11 12 8 9 5   Difficult doing work/chores Somewhat difficult Somewhat difficult Somewhat difficult Somewhat difficult Not difficult at all       07/26/2023    9:05 AM 01/25/2023    9:04 AM 11/30/2022    3:31 PM 10/10/2022    9:17 AM  GAD 7 : Generalized Anxiety Score  Nervous, Anxious, on Edge 1 1  0 0  Control/stop worrying 1 1 1  0  Worry too much - different things 1 1 1 1   Trouble relaxing 0 0 1 0  Restless 0 0 0 0  Easily annoyed or irritable 2 1 1  0  Afraid - awful might happen 1 1 1  0  Total GAD 7 Score 6 5 5 1   Anxiety Difficulty Somewhat difficult Somewhat difficult Somewhat difficult Not difficult at all   COPD Lung screening 12/22/22 = mild centrilobular emphysema + a 6 mm right upper lobe nodule with no changes on recheck in July 2024.  Currently smokes about 2 packs per week. COPD status: stable Satisfied with current treatment?: yes Oxygen use: no Dyspnea frequency: occasional with lots of exertion, is working out Cough frequency: none Rescue inhaler frequency:  does not have one Limitation of activity: no Productive cough: none Last Spirometry: unknown Pneumovax: Up to Date Influenza: Up to Date   HYPOTHYROIDISM Continues on Levothyroxine 75 MCG. Thyroid control status:stable Satisfied with current treatment? yes Medication side effects: no Medication compliance: good compliance Etiology of hypothyroidism: unknown Recent dose adjustment:no Fatigue: no Cold intolerance: no Heat intolerance:  no Weight gain: no Weight loss: no Constipation: no Diarrhea/loose stools: no Palpitations: no Lower extremity edema: no Anxiety/depressed mood: no   Relevant past medical, surgical, family and social history reviewed and updated as indicated. Interim medical history since our last visit reviewed. Allergies and medications reviewed and updated.  Review of Systems  Constitutional:  Negative for activity change, diaphoresis, fatigue and fever.  Respiratory:  Negative for cough, chest tightness, shortness of breath and wheezing.   Cardiovascular:  Negative for chest pain, palpitations and leg swelling.  Gastrointestinal: Negative.   Endocrine: Negative for cold intolerance and heat intolerance.  Neurological: Negative.   Psychiatric/Behavioral: Negative.      Per HPI unless specifically indicated above     Objective:    BP 114/73   Pulse 62   Temp 97.9 F (36.6 C) (Oral)   Ht 5\' 6"  (1.676 m)   Wt 183 lb 12.8 oz (83.4 kg)   SpO2 98%   BMI 29.67 kg/m   Wt Readings from Last 3 Encounters:  07/26/23 183 lb 12.8 oz (83.4 kg)  01/25/23 180 lb 12.8 oz (82 kg)  11/30/22 180 lb 9.6 oz (81.9 kg)    Physical Exam Vitals and nursing note reviewed.  Constitutional:      General: He is awake. He is not in acute distress.    Appearance: He is well-developed and well-groomed. He is not ill-appearing or toxic-appearing.  HENT:     Head: Normocephalic and atraumatic.     Right Ear: Hearing, tympanic membrane, ear canal and external ear normal. No drainage.     Left Ear: Hearing, tympanic membrane, ear canal and external ear normal. No drainage.     Nose: Nose normal.     Mouth/Throat:     Pharynx: Uvula midline.  Eyes:     General: Lids are normal.        Right eye: No discharge.        Left eye: No discharge.     Extraocular Movements: Extraocular movements intact.     Conjunctiva/sclera: Conjunctivae normal.     Pupils: Pupils are equal, round, and reactive to light.      Visual Fields: Right eye visual fields normal and left eye visual fields normal.  Neck:     Thyroid: No thyromegaly.     Vascular: No carotid bruit or JVD.  Trachea: Trachea normal.  Cardiovascular:     Rate and Rhythm: Normal rate and regular rhythm.     Heart sounds: Normal heart sounds, S1 normal and S2 normal. No murmur heard.    No gallop.  Pulmonary:     Effort: Pulmonary effort is normal. No accessory muscle usage or respiratory distress.     Breath sounds: Normal breath sounds.  Abdominal:     General: Bowel sounds are normal.     Palpations: Abdomen is soft. There is no hepatomegaly or splenomegaly.     Tenderness: There is no abdominal tenderness.  Musculoskeletal:        General: Normal range of motion.     Cervical back: Normal range of motion and neck supple.     Right lower leg: No edema.     Left lower leg: No edema.  Lymphadenopathy:     Head:     Right side of head: No submental, submandibular, tonsillar, preauricular or posterior auricular adenopathy.     Left side of head: No submental, submandibular, tonsillar, preauricular or posterior auricular adenopathy.     Cervical: No cervical adenopathy.  Skin:    General: Skin is warm and dry.     Capillary Refill: Capillary refill takes less than 2 seconds.     Findings: No rash.  Neurological:     Mental Status: He is alert and oriented to person, place, and time.     Gait: Gait is intact.     Deep Tendon Reflexes: Reflexes are normal and symmetric.     Reflex Scores:      Brachioradialis reflexes are 2+ on the right side and 2+ on the left side.      Patellar reflexes are 2+ on the right side and 2+ on the left side. Psychiatric:        Attention and Perception: Attention normal.        Mood and Affect: Mood normal.        Speech: Speech normal.        Behavior: Behavior normal. Behavior is cooperative.        Thought Content: Thought content normal.        Cognition and Memory: Cognition normal.      Results for orders placed or performed in visit on 01/25/23  T4, free  Result Value Ref Range   Free T4 1.46 0.82 - 1.77 ng/dL  CBC with Differential/Platelet  Result Value Ref Range   WBC 7.4 3.4 - 10.8 x10E3/uL   RBC 5.25 4.14 - 5.80 x10E6/uL   Hemoglobin 15.2 13.0 - 17.7 g/dL   Hematocrit 09.8 11.9 - 51.0 %   MCV 87 79 - 97 fL   MCH 29.0 26.6 - 33.0 pg   MCHC 33.5 31.5 - 35.7 g/dL   RDW 14.7 82.9 - 56.2 %   Platelets 232 150 - 450 x10E3/uL   Neutrophils 48 Not Estab. %   Lymphs 38 Not Estab. %   Monocytes 10 Not Estab. %   Eos 3 Not Estab. %   Basos 1 Not Estab. %   Neutrophils Absolute 3.6 1.4 - 7.0 x10E3/uL   Lymphocytes Absolute 2.8 0.7 - 3.1 x10E3/uL   Monocytes Absolute 0.7 0.1 - 0.9 x10E3/uL   EOS (ABSOLUTE) 0.2 0.0 - 0.4 x10E3/uL   Basophils Absolute 0.1 0.0 - 0.2 x10E3/uL   Immature Granulocytes 0 Not Estab. %   Immature Grans (Abs) 0.0 0.0 - 0.1 x10E3/uL  Comprehensive metabolic panel  Result Value Ref Range  Glucose 92 70 - 99 mg/dL   BUN 14 6 - 24 mg/dL   Creatinine, Ser 7.25 0.76 - 1.27 mg/dL   eGFR 366 >44 IH/KVQ/2.59   BUN/Creatinine Ratio 17 9 - 20   Sodium 136 134 - 144 mmol/L   Potassium 3.7 3.5 - 5.2 mmol/L   Chloride 100 96 - 106 mmol/L   CO2 21 20 - 29 mmol/L   Calcium 9.4 8.7 - 10.2 mg/dL   Total Protein 7.0 6.0 - 8.5 g/dL   Albumin 4.7 3.8 - 4.9 g/dL   Globulin, Total 2.3 1.5 - 4.5 g/dL   Albumin/Globulin Ratio 2.0 1.2 - 2.2   Bilirubin Total 0.8 0.0 - 1.2 mg/dL   Alkaline Phosphatase 87 44 - 121 IU/L   AST 25 0 - 40 IU/L   ALT 29 0 - 44 IU/L  Lipid Panel w/o Chol/HDL Ratio  Result Value Ref Range   Cholesterol, Total 226 (H) 100 - 199 mg/dL   Triglycerides 563 (H) 0 - 149 mg/dL   HDL 46 >87 mg/dL   VLDL Cholesterol Cal 30 5 - 40 mg/dL   LDL Chol Calc (NIH) 564 (H) 0 - 99 mg/dL  TSH  Result Value Ref Range   TSH 2.940 0.450 - 4.500 uIU/mL  PSA  Result Value Ref Range   Prostate Specific Ag, Serum 0.8 0.0 - 4.0 ng/mL   Testosterone, free, total([facility])  Result Value Ref Range   Testosterone 375 264 - 916 ng/dL   Testosterone, Free 5.3 (L) 7.2 - 24.0 pg/mL   Sex Hormone Binding 52.4 19.3 - 76.4 nmol/L  Specimen status report  Result Value Ref Range   specimen status report Comment       Assessment & Plan:   Problem List Items Addressed This Visit       Respiratory   Centrilobular emphysema (HCC) - Primary    Ongoing, noted on initial lung screening 12/23/22 == educated patient on this finding and nodule, which remains stable on July 2024 recheck. Has Albuterol inhaler to use as needed, discussed with him.  Spirometry next visit and annually.          Endocrine   Hypothyroidism    Chronic, ongoing.  Check thyroid labs annually, up to date.  Continue current medication dosage and adjust as needed.        Other   Cigarette nicotine dependence without complication    I have recommended complete cessation of tobacco use. I have discussed various options available for assistance with tobacco cessation including over the counter methods (Nicotine gum, patch and lozenges). We also discussed prescription options (Chantix, Nicotine Inhaler / Nasal Spray). The patient is not interested in pursuing any prescription tobacco cessation options at this time.  Lung screening annually.       Depression, major, single episode, mild (HCC)    Ongoing with substance abuse.  Denies SI/HI.   He is motivated to cut back and work on cessation of alcohol and cocaine use.  Recommend he attend AA meetings to get sponsor for support.  Start Gabapentin with slow increase to goal 600 MG TID.  May offer benefit to mood and substance abuse.  Referral to therapy placed per patient request, this would offer benefit.        Hyperlipidemia    Chronic, ongoing with ASCVD 10.5%.  Continue Rosuvastatin as is tolerating and adjust dose as needed.  Lipid panel today.      Relevant Orders   Comprehensive metabolic panel  Lipid Panel w/o Chol/HDL Ratio   Substance abuse (HCC)    Chronic, ongoing issue with cocaine and alcohol use.  Naltrexone offered no benefit.  He is motivated to cut back and work on cessation.  Recommend he attend AA meetings to get sponsor for support.  Start Gabapentin with slow increase to goal 600 MG TID.  May offer benefit to mood and substance abuse.  Referral to therapy placed per patient request, this would offer benefit.  Denies SI/HI.      Relevant Orders   Ambulatory referral to Psychology     Follow up plan: Return in about 6 weeks (around 09/06/2023) for ALCOHOL AND COCAINE ABUSE.

## 2023-07-26 NOTE — Assessment & Plan Note (Signed)
I have recommended complete cessation of tobacco use. I have discussed various options available for assistance with tobacco cessation including over the counter methods (Nicotine gum, patch and lozenges). We also discussed prescription options (Chantix, Nicotine Inhaler / Nasal Spray). The patient is not interested in pursuing any prescription tobacco cessation options at this time.  Lung screening annually.

## 2023-07-26 NOTE — Assessment & Plan Note (Signed)
Chronic, ongoing issue with cocaine and alcohol use.  Naltrexone offered no benefit.  He is motivated to cut back and work on cessation.  Recommend he attend AA meetings to get sponsor for support.  Start Gabapentin with slow increase to goal 600 MG TID.  May offer benefit to mood and substance abuse.  Referral to therapy placed per patient request, this would offer benefit.  Denies SI/HI.

## 2023-07-26 NOTE — Assessment & Plan Note (Signed)
Chronic, ongoing.  Check thyroid labs annually, up to date.  Continue current medication dosage and adjust as needed.

## 2023-07-26 NOTE — Assessment & Plan Note (Signed)
Chronic, ongoing with ASCVD 10.5%.  Continue Rosuvastatin as is tolerating and adjust dose as needed.  Lipid panel today.

## 2023-07-26 NOTE — Assessment & Plan Note (Signed)
Ongoing with substance abuse.  Denies SI/HI.   He is motivated to cut back and work on cessation of alcohol and cocaine use.  Recommend he attend AA meetings to get sponsor for support.  Start Gabapentin with slow increase to goal 600 MG TID.  May offer benefit to mood and substance abuse.  Referral to therapy placed per patient request, this would offer benefit.

## 2023-07-27 NOTE — Progress Notes (Signed)
Contacted via MyChart   Good afternoon Sears, your labs have returned: - Kidney function, creatinine and eGFR, remains normal. - Liver function, AST and ALT, are elevated at 60 and 136 -- this is related to your current increased alcohol and cocaine use.  Please work on cutting back to complete cessation of both.  Take the Gabapentin as instructed and we will see if it helps.  Will recheck labs next visit, but if they trend up more we may need to get ultrasound of liver. - Cholesterol labs are trending down, continue Rosuvastatin every day at this time and we may increase dosing for better control at a future visit. Any questions? Keep being stellar!!  Thank you for allowing me to participate in your care.  I appreciate you. Kindest regards, Purl Claytor

## 2023-09-03 NOTE — Patient Instructions (Signed)
Cocaine Use Disorder Cocaine use disorder is a condition in which your cocaine use disrupts your daily life. Cocaine belongs to a group of powerful drugs known as stimulants. Other names for cocaine include coke, crack, blow, snow, C, powder, and nose candy. This drug has some medical uses, but these are rare. Cocaine is most often misused because of its effects, which include: A feeling of extreme pleasure (euphoria). Alertness. A high energy level. Cocaine use disorder can be dangerous because: Cocaine increases your blood pressure. Using cocaine can lead to a heart attack or stroke. Cocaine use can cause fast or irregular heartbeats and seizures. Non-medical cocaine use is illegal and can lead to criminal charges. Cocaine use can be life-threatening. What are the causes? This condition is caused by misusing cocaine. Many people start using cocaine because it makes them feel good or helps them deal with their lives. Over time, they get addicted to it. When they try to stop using it, they feel sick. What increases the risk? The following factors may make you more likely to develop this condition: Misusing other drugs. A family history of misusing drugs. History of mental illness. What are the signs or symptoms? Symptoms of this condition include: Craving cocaine, which can lead to: Using more cocaine than you want to, or using cocaine for longer than you want to. Being unable to slow down or stop your cocaine use. Needing more and more cocaine to get the same effects (building up a tolerance). Spending a lot of time getting cocaine, using it, or recovering from its effects. Having problems at work, at school, at home, or with relationships because of cocaine use. Giving up or cutting down on important life activities because of cocaine use. Using cocaine when it is dangerous, such as when driving a car. Continuing to use cocaine even though it has led to physical problems, such as: Poor  nutrition. Nosebleeds. Chest pain. Continuing to use cocaine even though it is affecting your mental health. You may develop: Schizophrenia-like symptoms. You may think or act in an unusual way. Depression. Bipolar mood swings. Anxiety. Sleep problems. If you stop using cocaine, you may have symptoms of withdrawal, such as: Depression. Bad dreams or sleep problems. Tiredness (fatigue) or slowed thinking. Increased appetite. How is this diagnosed? This condition is diagnosed based on: A physical exam. Your history of cocaine use. Your symptoms. This includes: How cocaine use affects your life. Changes in personality, behaviors, and mood. Health or mental health issues related to using cocaine. Blood or urine tests to screen for drugs. How is this treated? The first goal of treatment is to stop your use of cocaine. This must be done safely and may involve: Taking part in group and individual counseling from specially trained mental health providers. Staying at a residential treatment center for several days or weeks. Attending daily counseling sessions at a treatment center. Taking medicines as told by your health care provider that: Ease symptoms and prevent complications during withdrawal. Block cravings and block the good feeling that you get from using cocaine. Treat other mental health issues, such as depression or anxiety. Participating in a support group to share your experience with others who are going through the same thing. Recovery can be a long process. Many people who undergo treatment start using the drug again after stopping (relapse). If you relapse, it does not mean that treatment will not work. Follow these instructions at home: Medicines Take over-the-counter and prescription medicines only as told by your  health care provider. Check with your health care provider before starting any new medicines, vitamins, herbs, or supplements. General instructions      Do not use any drugs or alcohol. Do not use any products that contain nicotine or tobacco. These products include cigarettes, chewing tobacco, and vaping devices, such as e-cigarettes. If you need help quitting, ask your health care provider. Avoid people and activities that trigger your use of cocaine. Learn and practice techniques for managing stress. Have a plan for vulnerable moments. Get phone numbers of those who are willing to help and who are committed to your recovery. Attend support groups regularly for emotional support, advice, and guidance. Keep all follow-up visits. This is important for recovery and includes continuing to work with therapists and support groups. Where to find more information [employer] on Drug Abuse: SkinCoat.nl Substance Abuse and Mental Health Services Administration: RockToxic.pl Narcotics Anonymous: DestructiveBlog.cz Contact a health care provider if: Your symptoms get worse. You use cocaine again. You cannot take your medicines as told. Get help right away if you have: Chest pain. Any symptoms of a stroke. "BE FAST" is an easy way to remember the main warning signs of a stroke: B - Balance. Signs are dizziness, sudden trouble walking, or loss of balance. E - Eyes. Signs are trouble seeing or a sudden change in vision. F - Face. Signs are sudden weakness or numbness of the face, or the face or eyelid drooping on one side. A - Arms. Signs are weakness or numbness in an arm. This happens suddenly and usually on one side of the body. S - Speech. Signs are sudden trouble speaking, slurred speech, or trouble understanding what people say. T - Time. Time to call emergency services. Write down what time symptoms started. Other signs of a stroke, such as: A sudden, severe headache with no known cause. Nausea or vomiting. Seizure. These symptoms may be an emergency. Get help right away. Call 911. Do not wait to see if the symptoms will go away. Do not drive  yourself to the hospital. Also, get help right away if: You have serious thoughts about hurting yourself or others. Take one of these steps if you feel like you may hurt yourself or others, or have thoughts about taking your own life: Go to your nearest emergency room. Call 911. Call the National Suicide Prevention Lifeline at (819)784-4097 or 988. This is open 24 hours a day. Text the Crisis Text Line at (412) 197-1225. Summary Cocaine has some medical uses, but these are rare. Cocaine is most often misused because of its effects. Many people start using cocaine because it makes them feel good or helps them deal with their lives. Over time, they get addicted to it. Treatment for this condition is usually provided by specially trained mental health providers. This information is not intended to replace advice given to you by your health care provider. Make sure you discuss any questions you have with your health care provider. Document Revised: 04/04/2022 Document Reviewed: 04/04/2022 Elsevier Patient Education  2024 [employer].

## 2023-09-06 ENCOUNTER — Ambulatory Visit (INDEPENDENT_AMBULATORY_CARE_PROVIDER_SITE_OTHER): Payer: 59 | Admitting: Nurse Practitioner

## 2023-09-06 ENCOUNTER — Encounter: Payer: Self-pay | Admitting: Nurse Practitioner

## 2023-09-06 VITALS — BP 112/74 | HR 73 | Temp 98.5°F | Wt 178.8 lb

## 2023-09-06 DIAGNOSIS — F1910 Other psychoactive substance abuse, uncomplicated: Secondary | ICD-10-CM

## 2023-09-06 DIAGNOSIS — Z23 Encounter for immunization: Secondary | ICD-10-CM | POA: Diagnosis not present

## 2023-09-06 DIAGNOSIS — F320 Major depressive disorder, single episode, mild: Secondary | ICD-10-CM | POA: Diagnosis not present

## 2023-09-06 MED ORDER — DISULFIRAM 250 MG PO TABS: 250.0000 mg | ORAL_TABLET | Freq: Every day | ORAL | 6 refills | Status: DC

## 2023-09-06 NOTE — Progress Notes (Signed)
BP 112/74   Pulse 73   Temp 98.5 F (36.9 C) (Oral)   Wt 178 lb 12.8 oz (81.1 kg)   SpO2 98%   BMI 28.86 kg/m    Subjective:    Patient ID: Earl Baker, male    DOB: 09-Jan-1966, 57 y.o.   MRN: [id number]  HPI: Earl Baker is a 57 y.o. male  Chief Complaint  Patient presents with   Alcohol Problem   SUBSTANCE ABUSE Follow-up today, started Gabapentin on 07/26/23 but this did not offer benefit, made him more depressed.  Saw [facility] on Friday, they prescribed Antabuse, but has not started due to cost.  In past he took this and alcohol and cocaine for two years.   We tried Naltrexone for alcohol use, but this did not offer benefit to reduction.  Currently drinks about 2 cases of beer at week.  A few months ago started to use cocaine nasally, only when he drinks.  Does not drink alcohol every day.  Currently drinks 2 days a week, drinks heavily on those days.  On the days he works there is no alcohol or cocaine use.  He has history of cocaine use in past -- about 3 years ago.    Past went to [employer], but has never stuck with it.  Tried rehab, but was kicked out as insurance stopped paying.  Has also been to therapist.  His mother and father have heavy alcohol use.  Brothers both had issues with this, but they have quit.  Sister does not have any use. Depressed mood: yes Anxious mood: yes Anhedonia: yes Significant weight loss or gain: no Insomnia: yes hard to fall asleep Fatigue: yes Feelings of worthlessness or guilt: yes Impaired concentration/indecisiveness: no Suicidal ideations: no Hopelessness: yes Crying spells: yes    09/06/2023    4:14 PM 07/26/2023    9:05 AM 01/25/2023    9:04 AM 11/30/2022    3:30 PM 10/10/2022    9:15 AM  Depression screen PHQ 2/9  Decreased Interest 1 2 2 1 1   Down, Depressed, Hopeless 2 2 2 1 1   PHQ - 2 Score 3 4 4 2 2   Altered sleeping 1 1 2 1 1   Tired, decreased energy 3 3 2 3 2   Change in appetite 1 2 2 1 2   Feeling  bad or failure about yourself  1 1 1 1 1   Trouble concentrating 1 0 1 0 1  Moving slowly or fidgety/restless 0 0 0 0 0  Suicidal thoughts 0 0 0 0 0  PHQ-9 Score 10 11 12 8 9   Difficult doing work/chores Somewhat difficult Somewhat difficult Somewhat difficult Somewhat difficult Somewhat difficult      09/06/2023    4:14 PM 07/26/2023    9:05 AM 01/25/2023    9:04 AM 11/30/2022    3:31 PM  GAD 7 : Generalized Anxiety Score  Nervous, Anxious, on Edge 1 1 1  0  Control/stop worrying 1 1 1 1   Worry too much - different things 1 1 1 1   Trouble relaxing 1 0 0 1  Restless 0 0 0 0  Easily annoyed or irritable 1 2 1 1   Afraid - awful might happen 1 1 1 1   Total GAD 7 Score 6 6 5 5   Anxiety Difficulty Somewhat difficult Somewhat difficult Somewhat difficult Somewhat difficult    Flowsheet Row Office Visit from 07/26/2023 in [facility]  Alcohol Use Disorder Identification Test Final  Score (AUDIT) 23       Flowsheet Row Office Visit from 07/26/2023 in [facility]  AUDIT-C Score 8       Relevant past medical, surgical, family and social history reviewed and updated as indicated. Interim medical history since our last visit reviewed. Allergies and medications reviewed and updated.  Review of Systems  Constitutional:  Negative for activity change, diaphoresis, fatigue and fever.  Respiratory:  Negative for cough, chest tightness, shortness of breath and wheezing.   Cardiovascular:  Negative for chest pain, palpitations and leg swelling.  Gastrointestinal: Negative.   Endocrine: Negative for cold intolerance and heat intolerance.  Neurological: Negative.   Psychiatric/Behavioral:  Positive for sleep disturbance. Negative for decreased concentration, self-injury and suicidal ideas. The patient is nervous/anxious.     Per HPI unless specifically indicated above     Objective:    BP 112/74   Pulse 73   Temp 98.5 F (36.9 C) (Oral)   Wt  178 lb 12.8 oz (81.1 kg)   SpO2 98%   BMI 28.86 kg/m   Wt Readings from Last 3 Encounters:  09/06/23 178 lb 12.8 oz (81.1 kg)  07/26/23 183 lb 12.8 oz (83.4 kg)  01/25/23 180 lb 12.8 oz (82 kg)    Physical Exam Vitals and nursing note reviewed.  Constitutional:      General: He is awake. He is not in acute distress.    Appearance: He is well-developed and well-groomed. He is not ill-appearing or toxic-appearing.  HENT:     Head: Normocephalic and atraumatic.     Right Ear: Hearing normal. No drainage.     Left Ear: Hearing normal. No drainage.  Eyes:     General: Lids are normal.        Right eye: No discharge.        Left eye: No discharge.     Conjunctiva/sclera: Conjunctivae normal.     Pupils: Pupils are equal, round, and reactive to light.  Neck:     Thyroid: No thyromegaly.     Vascular: No carotid bruit.  Cardiovascular:     Rate and Rhythm: Normal rate and regular rhythm.     Heart sounds: Normal heart sounds, S1 normal and S2 normal. No murmur heard.    No gallop.  Pulmonary:     Effort: Pulmonary effort is normal. No accessory muscle usage or respiratory distress.     Breath sounds: Normal breath sounds. No decreased breath sounds, wheezing or rhonchi.  Abdominal:     General: Bowel sounds are normal.     Palpations: Abdomen is soft. There is no hepatomegaly or splenomegaly.  Musculoskeletal:        General: Normal range of motion.     Cervical back: Normal range of motion and neck supple.     Right lower leg: No edema.     Left lower leg: No edema.  Lymphadenopathy:     Cervical: No cervical adenopathy.  Skin:    General: Skin is warm and dry.     Capillary Refill: Capillary refill takes less than 2 seconds.  Neurological:     Mental Status: He is alert and oriented to person, place, and time.     Deep Tendon Reflexes: Reflexes are normal and symmetric.  Psychiatric:        Attention and Perception: Attention normal.        Mood and Affect: Mood normal.         Speech: Speech normal.  Behavior: Behavior normal. Behavior is cooperative.        Thought Content: Thought content normal.     Results for orders placed or performed in visit on 07/26/23  Comprehensive metabolic panel  Result Value Ref Range   Glucose 93 70 - 99 mg/dL   BUN 13 6 - 24 mg/dL   Creatinine, Ser 1.32 0.76 - 1.27 mg/dL   eGFR 440 >10 UV/OZD/6.64   BUN/Creatinine Ratio 16 9 - 20   Sodium 138 134 - 144 mmol/L   Potassium 3.8 3.5 - 5.2 mmol/L   Chloride 101 96 - 106 mmol/L   CO2 20 20 - 29 mmol/L   Calcium 9.3 8.7 - 10.2 mg/dL   Total Protein 7.1 6.0 - 8.5 g/dL   Albumin 4.5 3.8 - 4.9 g/dL   Globulin, Total 2.6 1.5 - 4.5 g/dL   Bilirubin Total 0.9 0.0 - 1.2 mg/dL   Alkaline Phosphatase 116 44 - 121 IU/L   AST 60 (H) 0 - 40 IU/L   ALT 136 (H) 0 - 44 IU/L  Lipid Panel w/o Chol/HDL Ratio  Result Value Ref Range   Cholesterol, Total 170 100 - 199 mg/dL   Triglycerides 403 (H) 0 - 149 mg/dL   HDL 48 >47 mg/dL   VLDL Cholesterol Cal 30 5 - 40 mg/dL   LDL Chol Calc (NIH) 92 0 - 99 mg/dL      Assessment & Plan:   Problem List Items Addressed This Visit       Other   Depression, major, single episode, mild (HCC) - Primary   Substance abuse (HCC)    Chronic, ongoing issue with cocaine and alcohol use. Denies SI/HI. Naltrexone offered no benefit and Gabapentin made depressed.  Has seen therapy who recommended Antabuse, this worked well for him in past.  He is motivated to cut back and work on cessation.  Recommend he attend AA meetings to get sponsor for support.  Start Antabuse 250 MG daily, will see if can get covered on 30 day supplies -- if not will reach out to [facility] pharmacist on this.  This medication helped him quit for 2 years in the past.      Other Visit Diagnoses     Flu vaccine need       Flu vaccine due and provided in office today, educated patient and consent obtained.   Relevant Orders   Flu vaccine trivalent PF, 6mos and  older(Flulaval,Afluria,Fluarix,Fluzone) (Completed)        Follow up plan: Return in about 4 weeks (around 10/04/2023) for SUBSTANCE ABUSE.

## 2023-09-06 NOTE — Assessment & Plan Note (Signed)
Chronic, ongoing issue with cocaine and alcohol use. Denies SI/HI. Naltrexone offered no benefit and Gabapentin made depressed.  Has seen therapy who recommended Antabuse, this worked well for him in past.  He is motivated to cut back and work on cessation.  Recommend he attend AA meetings to get sponsor for support.  Start Antabuse 250 MG daily, will see if can get covered on 30 day supplies -- if not will reach out to [facility] pharmacist on this.  This medication helped him quit for 2 years in the past.

## 2023-10-07 NOTE — Patient Instructions (Signed)
Finding Treatment for Addiction Addiction is a complex disease of the brain that causes an uncontrollable (compulsive) need for: A substance. This includes alcohol, drugs, or prescription medicines, such as painkillers. An activity or behavior, such as gambling or shopping. What are the risks? Addiction is a progressive disease. Without treatment, addiction can get worse. Living with addiction puts you at higher risk for injury, poor health, loss of employment, loss of money, and even death. Addiction changes the way your brain works. Because of this change: The need for the medicine, drug, or activity can become so strong that you think about it all the time. Getting more and more of your addiction becomes the most important thing to you. You may find yourself leaving other activities and relationships to pursue your addiction. You can become physically dependent on a substance. Your health, behavior, emotions, and relationships can change for the worse. How to select a treatment program Know your options There may be options for treatment programs and plans based on your addiction, condition, needs, and preferences. No single treatment is right for everyone. Treatment programs can be: Outpatient. You live at home and go to work or school, but you go to a clinic for treatment. Inpatient. You live and sleep at the program facility during treatment. Programs may include: Medicine. You may need medicine to treat the addiction itself or to treat anxiety or depression. Counseling and behavior therapy. This can help individuals and families behave in healthier ways and relate more effectively. Support groups. Confidential group therapy, such as a 12-step program, can help individuals and families during treatment and recovery. A combination of education, counseling, and a 12-step, spirituality-based approach.  Think about your needs Think about your individual requirements when selecting a  treatment program. Ask about: The overall approach to treatment. Some programs are strictly 12-step programs. Some have a more flexible approach. Programs may differ in length of stay, setting, and size. Some programs include your family in your treatment plan. Support may be offered to them throughout the treatment process, as well as instructions for them when you are discharged. You may continue to receive support after you have left the program. The types of medical services that are offered. Find out if the program: Offers specific treatment for your particular addiction. Meets all of your needs, including physical and cultural needs. Includes any medicines you might need. Offers mental health counseling as part of your treatment. Offers the 12-step meetings at the center, or if transport is available for you to attend meetings at other locations. The cost and types of insurance that are accepted. Some programs are sponsored by the government. They support people in treatment who do not have private insurance. If you do not have insurance, or if you choose to attend a program that does not accept your insurance, call the treatment center. Tell them your financial needs and ask whether a payment plan can be set up. There are also organizations that will help you find the resources for treatment. You can find them online by searching for "treatment for addiction." If the program is certified by the appropriate government agency. Follow these instructions at home: Find supportive people who will help you stay away from your addiction and stay sober. Do not use the substance or engage in the activity. If you have been through treatment: Follow your plan. The plan is usually developed by you and your health care provider during treatment. These discussions are confidential. Go to meetings with other people  in recovery. Avoid people, situations, and things that lead you to do the things you are  addicted to (triggers). Where to find more information Recovered: recovered.org Substance Abuse and Mental Health Services Administration [facility]): findtreatment.RockToxic.pl [employer] on Problem Gambling: www.ncpgambling.org Get help right away if: You have serious thoughts about hurting yourself or others. Get help right away if you feel like you may hurt yourself or others, or have thoughts about taking your own life. Go to your nearest emergency room or: Call 911. Call the National Suicide Prevention Lifeline at 801-067-2795 or 988 in the [place] This is open 24 hours a day. Text the Crisis Text Line at 504 315 9814. Summary Addiction changes the way your brain works. These changes cause a desire to repeat and increase the use of the substance or behavior. Addiction is a progressive disease. Without treatment, addiction can get worse. Living with addiction puts you at higher risk for injury, poor health, loss of employment, loss of money, and even death. There may be options for treatment programs and plans based on your addiction, condition, needs, and preferences. No single treatment is right for everyone. Your health care provider can help you find the right treatment. These discussions are confidential. This information is not intended to replace advice given to you by your health care provider. Make sure you discuss any questions you have with your health care provider. Document Revised: 07/01/2021 Document Reviewed: 06/09/2021 Elsevier Patient Education  2024 [employer].

## 2023-10-09 ENCOUNTER — Ambulatory Visit: Payer: 59 | Admitting: Nurse Practitioner

## 2023-10-09 ENCOUNTER — Encounter: Payer: Self-pay | Admitting: Nurse Practitioner

## 2023-10-09 VITALS — BP 101/62 | HR 67 | Temp 97.9°F | Ht 66.0 in | Wt 183.8 lb

## 2023-10-09 DIAGNOSIS — F1910 Other psychoactive substance abuse, uncomplicated: Secondary | ICD-10-CM

## 2023-10-09 DIAGNOSIS — F320 Major depressive disorder, single episode, mild: Secondary | ICD-10-CM | POA: Diagnosis not present

## 2023-10-09 MED ORDER — DISULFIRAM 250 MG PO TABS: 250.0000 mg | ORAL_TABLET | Freq: Every day | ORAL | 2 refills | Status: DC

## 2023-10-09 NOTE — Assessment & Plan Note (Addendum)
Chronic, ongoing issue with cocaine and alcohol use. Denies SI/HI. Naltrexone offered no benefit and Gabapentin made depressed.  Has seen therapy who recommended Antabuse, we are attempting to get him a supply to use.  He is motivated to cut back and work on cessation.  Recommend he attend AA meetings to get sponsor for support.  Antabuse 250 MG daily, will see if can get covered on 30 or 90 day supply -- if not will reach out to [facility] pharmacist on this.  This medication helped him quit for 2 years in the past.

## 2023-10-09 NOTE — Assessment & Plan Note (Addendum)
Ongoing with substance abuse.  Denies SI/HI.   He is motivated to cut back and work on cessation of alcohol and cocaine use.  Recommend he attend AA meetings to get sponsor for support.  Sent script for Antabuse to alternate pharmacy, they had a small supply.  May offer benefit to mood and substance abuse.  Seeing therapy as needed.

## 2023-10-09 NOTE — Progress Notes (Signed)
BP 101/62   Pulse 67   Temp 97.9 F (36.6 C) (Oral)   Ht 5\' 6"  (1.676 m)   Wt 183 lb 12.8 oz (83.4 kg)   SpO2 97%   BMI 29.67 kg/m    Subjective:    Patient ID: Earl Baker, male    DOB: [birth year], 57 y.o.   MRN: [id number]  HPI: GOERGE LOBODA is a 57 y.o. male  No chief complaint on file.  SUBSTANCE ABUSE Follow-up today, started Antabuse last visit and was not able to obtain -- not available, having trouble getting it at pharmacy.  Taking old ones from home, but not every day.  Saw [facility] on Friday, they prescribed Antabuse, but was costly.  In past he took this for alcohol and cocaine abuse for two years. Tried Naltrexone for alcohol use, but this did not offer benefit to reduction.    Currently drinks about 18 beers a week and drinking once a week.  Continues to use cocaine nasally, only when he drinks.  Does not drink alcohol every day. On the days he works there is no alcohol or cocaine use.  He has history of cocaine use in past -- about 3 years ago.    History: Past went to [employer], has not stuck with it.  Tried rehab, but insurance stopped paying.  Has also been to therapist.  His mother and father have heavy alcohol use.  Brothers both had issues with this, but they have quit.  Sister does not have any use. Depressed mood: occasional Anxious mood: occasional Anhedonia: yes Significant weight loss or gain: no Insomnia: yes hard to fall asleep Fatigue: yes Feelings of worthlessness or guilt: yes Impaired concentration/indecisiveness: no Suicidal ideations: no Hopelessness: yes Crying spells: yes    10/09/2023    4:09 PM 09/06/2023    4:14 PM 07/26/2023    9:05 AM 01/25/2023    9:04 AM 11/30/2022    3:30 PM  Depression screen PHQ 2/9  Decreased Interest 2 1 2 2 1   Down, Depressed, Hopeless 2 2 2 2 1   PHQ - 2 Score 4 3 4 4 2   Altered sleeping 1 1 1 2 1   Tired, decreased energy 3 3 3 2 3   Change in appetite 1 1 2 2 1   Feeling bad or  failure about yourself  2 1 1 1 1   Trouble concentrating 1 1 0 1 0  Moving slowly or fidgety/restless 0 0 0 0 0  Suicidal thoughts 0 0 0 0 0  PHQ-9 Score 12 10 11 12 8   Difficult doing work/chores Somewhat difficult Somewhat difficult Somewhat difficult Somewhat difficult Somewhat difficult      10/09/2023    4:10 PM 09/06/2023    4:14 PM 07/26/2023    9:05 AM 01/25/2023    9:04 AM  GAD 7 : Generalized Anxiety Score  Nervous, Anxious, on Edge 0 1 1 1   Control/stop worrying 1 1 1 1   Worry too much - different things 1 1 1 1   Trouble relaxing 0 1 0 0  Restless 0 0 0 0  Easily annoyed or irritable 1 1 2 1   Afraid - awful might happen 1 1 1 1   Total GAD 7 Score 4 6 6 5   Anxiety Difficulty Somewhat difficult Somewhat difficult Somewhat difficult Somewhat difficult    Flowsheet Row Office Visit from 07/26/2023 in [facility]  Alcohol Use Disorder Identification Test Final Score (AUDIT) 23  Flowsheet Row Office Visit from 07/26/2023 in [facility]  AUDIT-C Score 8       Relevant past medical, surgical, family and social history reviewed and updated as indicated. Interim medical history since our last visit reviewed. Allergies and medications reviewed and updated.  Review of Systems  Constitutional:  Negative for activity change, diaphoresis, fatigue and fever.  Respiratory:  Negative for cough, chest tightness, shortness of breath and wheezing.   Cardiovascular:  Negative for chest pain, palpitations and leg swelling.  Gastrointestinal: Negative.   Endocrine: Negative for cold intolerance and heat intolerance.  Neurological: Negative.   Psychiatric/Behavioral:  Positive for sleep disturbance. Negative for decreased concentration, self-injury and suicidal ideas. The patient is nervous/anxious.    Per HPI unless specifically indicated above     Objective:    BP 101/62   Pulse 67   Temp 97.9 F (36.6 C) (Oral)   Ht 5\' 6"   (1.676 m)   Wt 183 lb 12.8 oz (83.4 kg)   SpO2 97%   BMI 29.67 kg/m   Wt Readings from Last 3 Encounters:  10/09/23 183 lb 12.8 oz (83.4 kg)  09/06/23 178 lb 12.8 oz (81.1 kg)  07/26/23 183 lb 12.8 oz (83.4 kg)    Physical Exam Vitals and nursing note reviewed.  Constitutional:      General: He is awake. He is not in acute distress.    Appearance: He is well-developed and well-groomed. He is not ill-appearing or toxic-appearing.  HENT:     Head: Normocephalic and atraumatic.     Right Ear: Hearing normal. No drainage.     Left Ear: Hearing normal. No drainage.  Eyes:     General: Lids are normal.        Right eye: No discharge.        Left eye: No discharge.     Conjunctiva/sclera: Conjunctivae normal.     Pupils: Pupils are equal, round, and reactive to light.  Neck:     Thyroid: No thyromegaly.     Vascular: No carotid bruit.  Cardiovascular:     Rate and Rhythm: Normal rate and regular rhythm.     Heart sounds: Normal heart sounds, S1 normal and S2 normal. No murmur heard.    No gallop.  Pulmonary:     Effort: Pulmonary effort is normal. No accessory muscle usage or respiratory distress.     Breath sounds: Normal breath sounds. No decreased breath sounds, wheezing or rhonchi.  Abdominal:     General: Bowel sounds are normal.     Palpations: Abdomen is soft. There is no hepatomegaly or splenomegaly.  Musculoskeletal:        General: Normal range of motion.     Cervical back: Normal range of motion and neck supple.     Right lower leg: No edema.     Left lower leg: No edema.  Lymphadenopathy:     Cervical: No cervical adenopathy.  Skin:    General: Skin is warm and dry.     Capillary Refill: Capillary refill takes less than 2 seconds.  Neurological:     Mental Status: He is alert and oriented to person, place, and time.     Deep Tendon Reflexes: Reflexes are normal and symmetric.  Psychiatric:        Attention and Perception: Attention normal.        Mood and  Affect: Mood normal.        Speech: Speech normal.  Behavior: Behavior normal. Behavior is cooperative.        Thought Content: Thought content normal.    Results for orders placed or performed in visit on 07/26/23  Comprehensive metabolic panel  Result Value Ref Range   Glucose 93 70 - 99 mg/dL   BUN 13 6 - 24 mg/dL   Creatinine, Ser 0.10 0.76 - 1.27 mg/dL   eGFR 272 >53 GU/YQI/3.47   BUN/Creatinine Ratio 16 9 - 20   Sodium 138 134 - 144 mmol/L   Potassium 3.8 3.5 - 5.2 mmol/L   Chloride 101 96 - 106 mmol/L   CO2 20 20 - 29 mmol/L   Calcium 9.3 8.7 - 10.2 mg/dL   Total Protein 7.1 6.0 - 8.5 g/dL   Albumin 4.5 3.8 - 4.9 g/dL   Globulin, Total 2.6 1.5 - 4.5 g/dL   Bilirubin Total 0.9 0.0 - 1.2 mg/dL   Alkaline Phosphatase 116 44 - 121 IU/L   AST 60 (H) 0 - 40 IU/L   ALT 136 (H) 0 - 44 IU/L  Lipid Panel w/o Chol/HDL Ratio  Result Value Ref Range   Cholesterol, Total 170 100 - 199 mg/dL   Triglycerides 425 (H) 0 - 149 mg/dL   HDL 48 >95 mg/dL   VLDL Cholesterol Cal 30 5 - 40 mg/dL   LDL Chol Calc (NIH) 92 0 - 99 mg/dL      Assessment & Plan:   Problem List Items Addressed This Visit       Other   Depression, major, single episode, mild (HCC) - Primary    Ongoing with substance abuse.  Denies SI/HI.   He is motivated to cut back and work on cessation of alcohol and cocaine use.  Recommend he attend AA meetings to get sponsor for support.  Sent script for Antabuse to alternate pharmacy, they had a small supply.  May offer benefit to mood and substance abuse.  Seeing therapy as needed.      Substance abuse (HCC)    Chronic, ongoing issue with cocaine and alcohol use. Denies SI/HI. Naltrexone offered no benefit and Gabapentin made depressed.  Has seen therapy who recommended Antabuse, we are attempting to get him a supply to use.  He is motivated to cut back and work on cessation.  Recommend he attend AA meetings to get sponsor for support.  Antabuse 250 MG daily, will  see if can get covered on 30 or 90 day supply -- if not will reach out to [facility] pharmacist on this.  This medication helped him quit for 2 years in the past.         Follow up plan: Return in about 8 weeks (around 12/04/2023) for COCAINE AND ALCOHOL ABUSE.

## 2023-12-04 ENCOUNTER — Ambulatory Visit: Payer: 59 | Admitting: Nurse Practitioner

## 2024-01-01 ENCOUNTER — Ambulatory Visit: Payer: 59 | Admitting: Nurse Practitioner

## 2024-01-28 NOTE — Patient Instructions (Signed)
 Be Involved in Caring For Your Health:  Taking Medications When medications are taken as directed, they can greatly improve your health. But if they are not taken as prescribed, they may not work. In some cases, not taking them correctly can be harmful. To help ensure your treatment remains effective and safe, understand your medications and how to take them. Bring your medications to each visit for review by your provider.  Your lab results, notes, and after visit summary will be available on My Chart. We strongly encourage you to use this feature. If lab results are abnormal the clinic will contact you with the appropriate steps. If the clinic does not contact you assume the results are satisfactory. You can always view your results on My Chart. If you have questions regarding your health or results, please contact the clinic during office hours. You can also ask questions on My Chart.  We at [facility] are grateful that you chose [place] to provide your care. We strive to provide evidence-based and compassionate care and are always looking for feedback. If you get a survey from the clinic please complete this so we can hear your opinions.  Healthy Eating, Adult Healthy eating may help you get and keep a healthy body weight, reduce the risk of chronic disease, and live a long and productive life. It is important to follow a healthy eating pattern. Your nutritional and calorie needs should be met mainly by different nutrient-rich foods. What are tips for following this plan? Reading food labels Read labels and choose the following: Reduced or low sodium products. Juices with 100% fruit juice. Foods with low saturated fats (<3 g per serving) and high polyunsaturated and monounsaturated fats. Foods with whole grains, such as whole wheat, cracked wheat, brown rice, and wild rice. Whole grains that are fortified with folic acid. This is recommended for females who are pregnant or who want  to become pregnant. Read labels and do not eat or drink the following: Foods or drinks with added sugars. These include foods that contain brown sugar, corn sweetener, corn syrup, dextrose, fructose, glucose, high-fructose corn syrup, honey, invert sugar, lactose, malt syrup, maltose, molasses, raw sugar, sucrose, trehalose, or turbinado sugar. Limit your intake of added sugars to less than 10% of your total daily calories. Do not eat more than the following amounts of added sugar per day: 6 teaspoons (25 g) for females. 9 teaspoons (38 g) for males. Foods that contain processed or refined starches and grains. Refined grain products, such as white flour, degermed cornmeal, white bread, and white rice. Shopping Choose nutrient-rich snacks, such as vegetables, whole fruits, and nuts. Avoid high-calorie and high-sugar snacks, such as potato chips, fruit snacks, and candy. Use oil-based dressings and spreads on foods instead of solid fats such as butter, margarine, sour cream, or cream cheese. Limit pre-made sauces, mixes, and "instant" products such as flavored rice, instant noodles, and ready-made pasta. Try more plant-protein sources, such as tofu, tempeh, black beans, edamame, lentils, nuts, and seeds. Explore eating plans such as the Mediterranean diet or vegetarian diet. Try heart-healthy dips made with beans and healthy fats like hummus and guacamole. Vegetables go great with these. Cooking Use oil to saut or stir-fry foods instead of solid fats such as butter, margarine, or lard. Try baking, boiling, grilling, or broiling instead of frying. Remove the fatty part of meats before cooking. Steam vegetables in water or broth. Meal planning  At meals, imagine dividing your plate into fourths: One-half of  your plate is fruits and vegetables. One-fourth of your plate is whole grains. One-fourth of your plate is protein, especially lean meats, poultry, eggs, tofu, beans, or nuts. Include  low-fat dairy as part of your daily diet. Lifestyle Choose healthy options in all settings, including home, work, school, restaurants, or stores. Prepare your food safely: Wash your hands after handling raw meats. Where you prepare food, keep surfaces clean by regularly washing with hot, soapy water. Keep raw meats separate from ready-to-eat foods, such as fruits and vegetables. Cook seafood, meat, poultry, and eggs to the recommended temperature. Get a food thermometer. Store foods at safe temperatures. In general: Keep cold foods at 76F (4.4C) or below. Keep hot foods at 176F (60C) or above. Keep your freezer at [facility] (-17.8C) or below. Foods are not safe to eat if they have been between the temperatures of 40-176F (4.4-60C) for more than 2 hours. What foods should I eat? Fruits Aim to eat 1-2 cups of fresh, canned (in natural juice), or frozen fruits each day. One cup of fruit equals 1 small apple, 1 large banana, 8 large strawberries, 1 cup (237 g) canned fruit,  cup (82 g) dried fruit, or 1 cup (240 mL) 100% juice. Vegetables Aim to eat 2-4 cups of fresh and frozen vegetables each day, including different varieties and colors. One cup of vegetables equals 1 cup (91 g) broccoli or cauliflower florets, 2 medium carrots, 2 cups (150 g) raw, leafy greens, 1 large tomato, 1 large bell pepper, 1 large sweet potato, or 1 medium white potato. Grains Aim to eat 5-10 ounce-equivalents of whole grains each day. Examples of 1 ounce-equivalent of grains include 1 slice of bread, 1 cup (40 g) ready-to-eat cereal, 3 cups (24 g) popcorn, or  cup (93 g) cooked rice. Meats and other proteins Try to eat 5-7 ounce-equivalents of protein each day. Examples of 1 ounce-equivalent of protein include 1 egg,  oz nuts (12 almonds, 24 pistachios, or 7 walnut halves), 1/4 cup (90 g) cooked beans, 6 tablespoons (90 g) hummus or 1 tablespoon (16 g) peanut butter. A cut of meat or fish that is the size of a deck  of cards is about 3-4 ounce-equivalents (85 g). Of the protein you eat each week, try to have at least 8 sounce (227 g) of seafood. This is about 2 servings per week. This includes salmon, trout, herring, sardines, and anchovies. Dairy Aim to eat 3 cup-equivalents of fat-free or low-fat dairy each day. Examples of 1 cup-equivalent of dairy include 1 cup (240 mL) milk, 8 ounces (250 g) yogurt, 1 ounces (44 g) natural cheese, or 1 cup (240 mL) fortified soy milk. Fats and oils Aim for about 5 teaspoons (21 g) of fats and oils per day. Choose monounsaturated fats, such as canola and olive oils, mayonnaise made with olive oil or avocado oil, avocados, peanut butter, and most nuts, or polyunsaturated fats, such as sunflower, corn, and soybean oils, walnuts, pine nuts, sesame seeds, sunflower seeds, and flaxseed. Beverages Aim for 6 eight-ounce glasses of water per day. Limit coffee to 3-5 eight-ounce cups per day. Limit caffeinated beverages that have added calories, such as soda and energy drinks. If you drink alcohol: Limit how much you have to: 0-1 drink a day if you are male. 0-2 drinks a day if you are male. Know how much alcohol is in your drink. In the [place] one drink is one 12 oz bottle of beer (355 mL), one 5 oz glass of wine (  148 mL), or one 1 oz glass of hard liquor (44 mL). Seasoning and other foods Try not to add too much salt to your food. Try using herbs and spices instead of salt. Try not to add sugar to food. This information is based on [place] nutrition guidelines. To learn more, visit DisposableN[employer].be. Exact amounts may vary. You may need different amounts. This information is not intended to replace advice given to you by your health care provider. Make sure you discuss any questions you have with your health care provider. Document Revised: 09/05/2022 Document Reviewed: 09/05/2022 Elsevier Patient Education  2024 [employer].

## 2024-01-30 ENCOUNTER — Ambulatory Visit (INDEPENDENT_AMBULATORY_CARE_PROVIDER_SITE_OTHER): Payer: 59 | Admitting: Nurse Practitioner

## 2024-01-30 VITALS — BP 101/65 | HR 64 | Temp 98.4°F | Ht 65.5 in | Wt 180.6 lb

## 2024-01-30 DIAGNOSIS — Z0000 Encounter for general adult medical examination without abnormal findings: Secondary | ICD-10-CM

## 2024-01-30 DIAGNOSIS — E782 Mixed hyperlipidemia: Secondary | ICD-10-CM

## 2024-01-30 DIAGNOSIS — E039 Hypothyroidism, unspecified: Secondary | ICD-10-CM | POA: Diagnosis not present

## 2024-01-30 DIAGNOSIS — F320 Major depressive disorder, single episode, mild: Secondary | ICD-10-CM | POA: Diagnosis not present

## 2024-01-30 DIAGNOSIS — F17210 Nicotine dependence, cigarettes, uncomplicated: Secondary | ICD-10-CM

## 2024-01-30 DIAGNOSIS — G4733 Obstructive sleep apnea (adult) (pediatric): Secondary | ICD-10-CM

## 2024-01-30 DIAGNOSIS — F1910 Other psychoactive substance abuse, uncomplicated: Secondary | ICD-10-CM | POA: Diagnosis not present

## 2024-01-30 DIAGNOSIS — N400 Enlarged prostate without lower urinary tract symptoms: Secondary | ICD-10-CM

## 2024-01-30 DIAGNOSIS — J432 Centrilobular emphysema: Secondary | ICD-10-CM | POA: Diagnosis not present

## 2024-01-30 MED ORDER — DISULFIRAM 250 MG PO TABS: 250.0000 mg | ORAL_TABLET | Freq: Every day | ORAL | 2 refills | Status: DC

## 2024-01-30 MED ORDER — LEVOTHYROXINE SODIUM 75 MCG PO TABS: 75.0000 ug | ORAL_TABLET | Freq: Every day | ORAL | 3 refills | Status: AC

## 2024-01-30 MED ORDER — ROSUVASTATIN CALCIUM 10 MG PO TABS: 10.0000 mg | ORAL_TABLET | Freq: Every day | ORAL | 4 refills | Status: AC

## 2024-01-30 NOTE — Assessment & Plan Note (Signed)
Praised for cutting back.  I have recommended complete cessation of tobacco use. I have discussed various options available for assistance with tobacco cessation including over the counter methods (Nicotine gum, patch and lozenges). We also discussed prescription options (Chantix, Nicotine Inhaler / Nasal Spray). The patient is not interested in pursuing any prescription tobacco cessation options at this time.  Lung screening annually.

## 2024-01-30 NOTE — Assessment & Plan Note (Signed)
Chronic, ongoing with ASCVD 6.4%.  Continue Rosuvastatin as is tolerating and adjust dose as needed.  Lipid panel today.

## 2024-01-30 NOTE — Assessment & Plan Note (Signed)
Chronic issue with cocaine and alcohol use. Denies SI/HI. Currently he has not used either since Hyt[place] Years Eve.  Antabuse offering benefit, continue this.  Naltrexone offered no benefit and Gabapentin made depressed.  He is motivated to cut back and continue work on cessation.  Recommend he attend AA meetings to get sponsor for support.  Antabuse 250 MG daily.

## 2024-01-30 NOTE — Assessment & Plan Note (Signed)
Ongoing with substance abuse.  Denies SI/HI.   He is motivated to cut back and work on cessation of alcohol and cocaine use.  Recommend he attend AA meetings to get sponsor for support.  Continue Antabuse.  Is offering benefit to mood and substance abuse.  Seeing therapy as needed.

## 2024-01-30 NOTE — Assessment & Plan Note (Signed)
Ongoing, stable.  Noted on initial lung screening 12/23/22 == educated patient on this finding and nodule, which remains stable on July 2024 recheck. Has Albuterol inhaler to use as needed, discussed with him.  Spirometry next visit and annually.

## 2024-01-30 NOTE — Progress Notes (Signed)
BP 101/65   Pulse 64   Temp 98.4 F (36.9 C) (Oral)   Ht 5' 5.5" (1.664 m)   Wt 180 lb 9.6 oz (81.9 kg)   SpO2 98%   BMI 29.60 kg/m    Subjective:    Patient ID: Earl Baker, male    DOB: 1965-12-28, 58 y.o.   MRN: [id number]  HPI: Earl Baker is a 58 y.o. male presenting on 01/30/2024 for comprehensive medical examination. Current medical complaints include: none  He currently lives with: self Interim Problems from his last visit: no  Continues on Rosuvastatin 10 MG daily. The 10-year ASCVD risk score (Arnett DK, et al., 2019) is: 6.4%   Values used to calculate the score:     Age: 2 years     Sex: Male     Is Non-Hispanic African American: No     Diabetic: No     Tobacco smoker: Yes     Systolic Blood Pressure: 101 mmHg     Is BP treated: No     HDL Cholesterol: 48 mg/dL     Total Cholesterol: 170 mg/dL   COPD Mild centrilobular emphysema + a nodule is present but it had not changed on recent scan.  Currently smokes about 1 pack per week, this is a reduction for him, used to be 1 PPD.  Was about 19 when started smoking.  Taking Antabuse daily for alcohol use reduction, started on 10/09/23 -- n alcohol use since New Years Eve or cocaine use. Not attending [employer] yet.  COPD status: stable Satisfied with current treatment?: yes Oxygen use: no Dyspnea frequency: occasional with lots of exertion, is working out Cough frequency: none Rescue inhaler frequency:  does not have one Limitation of activity: no Productive cough: none Last Spirometry: unknown Pneumovax: Up to Date Influenza: Up to Date   HYPOTHYROIDISM Continues on Levothyroxine 75 MCG. Thyroid control status:stable Satisfied with current treatment? yes Medication side effects: no Medication compliance: good compliance Etiology of hypothyroidism: unknown Recent dose adjustment:no Fatigue: no Cold intolerance: no Heat intolerance: no Weight gain: no Weight loss: no Constipation:  no Diarrhea/loose stools: no Palpitations: no Lower extremity edema: no Anxiety/depressed mood: no   Functional Status Survey: Is the patient deaf or have difficulty hearing?: No Does the patient have difficulty seeing, even when wearing glasses/contacts?: No Does the patient have difficulty concentrating, remembering, or making decisions?: No Does the patient have difficulty walking or climbing stairs?: No Does the patient have difficulty dressing or bathing?: No Does the patient have difficulty doing errands alone such as visiting a doctor's office or shopping?: No     11/30/2022    3:30 PM 01/25/2023    9:26 AM 07/26/2023    9:03 AM 09/06/2023    4:14 PM 01/30/2024    3:07 PM  Fall Risk  Falls in the past year? 0 0 0 0 0  Was there an injury with Fall? 0 0 0 0 0  Fall Risk Category Calculator 0 0 0 0 0  Fall Risk Category (Retired) Low      Patient at Risk for Falls Due to No Fall Risks No Fall Risks No Fall Risks No Fall Risks No Fall Risks  Fall risk Follow up Falls evaluation completed Falls prevention discussed Falls evaluation completed Falls evaluation completed Falls evaluation completed       01/30/2024    3:08 PM 10/09/2023    4:09 PM 09/06/2023    4:14 PM 07/26/2023    9:05  AM 01/25/2023    9:04 AM  Depression screen PHQ 2/9  Decreased Interest 1 2 1 2 2   Down, Depressed, Hopeless 1 2 2 2 2   PHQ - 2 Score 2 4 3 4 4   Altered sleeping 1 1 1 1 2   Tired, decreased energy 1 3 3 3 2   Change in appetite 1 1 1 2 2   Feeling bad or failure about yourself  1 2 1 1 1   Trouble concentrating 1 1 1  0 1  Moving slowly or fidgety/restless 0 0 0 0 0  Suicidal thoughts 0 0 0 0 0  PHQ-9 Score 7 12 10 11 12   Difficult doing work/chores Somewhat difficult Somewhat difficult Somewhat difficult Somewhat difficult Somewhat difficult       01/30/2024    3:08 PM 10/09/2023    4:10 PM 09/06/2023    4:14 PM 07/26/2023    9:05 AM  GAD 7 : Generalized Anxiety Score  Nervous, Anxious, on Edge 1  0 1 1  Control/stop worrying 1 1 1 1   Worry too much - different things 1 1 1 1   Trouble relaxing 1 0 1 0  Restless 0 0 0 0  Easily annoyed or irritable 1 1 1 2   Afraid - awful might happen 1 1 1 1   Total GAD 7 Score 6 4 6 6   Anxiety Difficulty Somewhat difficult Somewhat difficult Somewhat difficult Somewhat difficult   Advanced Directives Does patient have a HCPOA?    no If yes, name and contact information:  Does patient have a living will or MOST form?  no  Past Medical History:  Past Medical History:  Diagnosis Date   Alcoholism /alcohol abuse 09/22/2015   Arthritis    Hep C w/o coma, chronic (HCC)    Treated.  Pre 2010.   Hyperlipidemia    Hypothyroidism    Low back pain    Olecranon bursitis 07/24/2019   Sleep apnea    Substance abuse (HCC)    Surgical History:  Past Surgical History:  Procedure Laterality Date   APPENDECTOMY     COLONOSCOPY WITH PROPOFOL N/A 01/03/2019   Procedure: COLONOSCOPY WITH PROPOFOL;  Surgeon: Midge Minium, MD;  Location: [facility];  Service: Endoscopy;  Laterality: N/A;   POLYPECTOMY  01/03/2019   Procedure: POLYPECTOMY;  Surgeon: Midge Minium, MD;  Location: [facility];  Service: Endoscopy;;    Medications:  Current Outpatient Medications on File Prior to Visit  Medication Sig   Multiple Vitamins-Minerals (MULTIVITAMIN ADULT PO) Take by mouth.   No current facility-administered medications on file prior to visit.    Allergies:  No Known Allergies  Social History:  Social History   Socioeconomic History   Marital status: Single    Spouse name: Not on file   Number of children: Not on file   Years of education: Not on file   Highest education level: GED or equivalent  Occupational History   Not on file  Tobacco Use   Smoking status: Every Day    Current packs/day: 0.00    Average packs/day: 0.3 packs/day for 35.0 years (9.0 ttl pk-yrs)    Types: Cigarettes, E-cigarettes    Last attempt to quit:  12/20/2023    Years since quitting: 0.1   Smokeless tobacco: Never  Vaping Use   Vaping status: Former   Substances: Nicotine, Flavoring  Substance and Sexual Activity   Alcohol use: Not Currently    Alcohol/week: 24.0 standard drinks of alcohol    Types:  24 Cans of beer per week   Drug use: Yes    Types: Cocaine   Sexual activity: Not on file  Other Topics Concern   Not on file  Social History Narrative   Not on file   Social Drivers of Health   Financial Resource Strain: Low Risk  (01/30/2024)   Overall Financial Resource Strain (CARDIA)    Difficulty of Paying Living Expenses: Not very hard  Food Insecurity: No Food Insecurity (01/30/2024)   Hunger Vital Sign    Worried About Running Out of Food in the Last Year: Never true    Ran Out of Food in the Last Year: Never true  Transportation Needs: No Transportation Needs (01/30/2024)   PRAPARE - Administrator, Civil Service (Medical): No    Lack of Transportation (Non-Medical): No  Physical Activity: Insufficiently Active (01/30/2024)   Exercise Vital Sign    Days of Exercise per Week: 1 day    Minutes of Exercise per Session: 30 min  Stress: No Stress Concern Present (01/30/2024)   [employer] of Occupational Health - Occupational Stress Questionnaire    Feeling of Stress : Only a little  Social Connections: Moderately Isolated (01/30/2024)   Social Connection and Isolation Panel [NHANES]    Frequency of Communication with Friends and Family: Twice a week    Frequency of Social Gatherings with Friends and Family: Once a week    Attends Religious Services: 1 to 4 times per year    Active Member of [employer] or Organizations: No    Attends Engineer, structural: Not on file    Marital Status: Never married  Intimate Partner Violence: Not At Risk (01/30/2024)   Humiliation, Afraid, Rape, and Kick questionnaire    Fear of Current or Ex-Partner: No    Emotionally Abused: No    Physically Abused: No     Sexually Abused: No   Social History   Tobacco Use  Smoking Status Every Day   Current packs/day: 0.00   Average packs/day: 0.3 packs/day for 35.0 years (9.0 ttl pk-yrs)   Types: Cigarettes, E-cigarettes   Last attempt to quit: 12/20/2023   Years since quitting: 0.1  Smokeless Tobacco Never   Social History   Substance and Sexual Activity  Alcohol Use Not Currently   Alcohol/week: 24.0 standard drinks of alcohol   Types: 24 Cans of beer per week    Family History:  Family History  Problem Relation Age of Onset   Hyperlipidemia Father    Cancer Father        prostate   Alcohol abuse Brother     Past medical history, surgical history, medications, allergies, family history and social history reviewed with patient today and changes made to appropriate areas of the chart.   ROS All other ROS negative except what is listed above and in the HPI.      Objective:    BP 101/65   Pulse 64   Temp 98.4 F (36.9 C) (Oral)   Ht 5' 5.5" (1.664 m)   Wt 180 lb 9.6 oz (81.9 kg)   SpO2 98%   BMI 29.60 kg/m   Wt Readings from Last 3 Encounters:  01/30/24 180 lb 9.6 oz (81.9 kg)  10/09/23 183 lb 12.8 oz (83.4 kg)  09/06/23 178 lb 12.8 oz (81.1 kg)    Physical Exam Vitals and nursing note reviewed.  Constitutional:      General: He is awake. He is not in acute distress.  Appearance: He is well-developed and well-groomed. He is not ill-appearing or toxic-appearing.  HENT:     Head: Normocephalic and atraumatic.     Right Ear: Hearing, tympanic membrane, ear canal and external ear normal. No drainage.     Left Ear: Hearing, tympanic membrane, ear canal and external ear normal. No drainage.     Nose: Nose normal.     Mouth/Throat:     Pharynx: Uvula midline.  Eyes:     General: Lids are normal.        Right eye: No discharge.        Left eye: No discharge.     Extraocular Movements: Extraocular movements intact.     Conjunctiva/sclera: Conjunctivae normal.     Pupils:  Pupils are equal, round, and reactive to light.     Visual Fields: Right eye visual fields normal and left eye visual fields normal.  Neck:     Thyroid: No thyromegaly.     Vascular: No carotid bruit or JVD.     Trachea: Trachea normal.  Cardiovascular:     Rate and Rhythm: Normal rate and regular rhythm.     Heart sounds: Normal heart sounds, S1 normal and S2 normal. No murmur heard.    No gallop.  Pulmonary:     Effort: Pulmonary effort is normal. No accessory muscle usage or respiratory distress.     Breath sounds: Normal breath sounds.  Abdominal:     General: Bowel sounds are normal.     Palpations: Abdomen is soft. There is no hepatomegaly or splenomegaly.     Tenderness: There is no abdominal tenderness.  Musculoskeletal:        General: Normal range of motion.     Cervical back: Normal range of motion and neck supple.     Right lower leg: No edema.     Left lower leg: No edema.  Lymphadenopathy:     Head:     Right side of head: No submental, submandibular, tonsillar, preauricular or posterior auricular adenopathy.     Left side of head: No submental, submandibular, tonsillar, preauricular or posterior auricular adenopathy.     Cervical: No cervical adenopathy.  Skin:    General: Skin is warm and dry.     Capillary Refill: Capillary refill takes less than 2 seconds.     Findings: No rash.  Neurological:     Mental Status: He is alert and oriented to person, place, and time.     Gait: Gait is intact.     Deep Tendon Reflexes: Reflexes are normal and symmetric.     Reflex Scores:      Brachioradialis reflexes are 2+ on the right side and 2+ on the left side.      Patellar reflexes are 2+ on the right side and 2+ on the left side. Psychiatric:        Attention and Perception: Attention normal.        Mood and Affect: Mood normal.        Speech: Speech normal.        Behavior: Behavior normal. Behavior is cooperative.        Thought Content: Thought content normal.         Cognition and Memory: Cognition normal.    Results for orders placed or performed in visit on 07/26/23  Comprehensive metabolic panel   Collection Time: 07/26/23  9:42 AM  Result Value Ref Range   Glucose 93 70 - 99 mg/dL   BUN 13  6 - 24 mg/dL   Creatinine, Ser 4.54 0.76 - 1.27 mg/dL   eGFR 098 >11 BJ/YNW/2.95   BUN/Creatinine Ratio 16 9 - 20   Sodium 138 134 - 144 mmol/L   Potassium 3.8 3.5 - 5.2 mmol/L   Chloride 101 96 - 106 mmol/L   CO2 20 20 - 29 mmol/L   Calcium 9.3 8.7 - 10.2 mg/dL   Total Protein 7.1 6.0 - 8.5 g/dL   Albumin 4.5 3.8 - 4.9 g/dL   Globulin, Total 2.6 1.5 - 4.5 g/dL   Bilirubin Total 0.9 0.0 - 1.2 mg/dL   Alkaline Phosphatase 116 44 - 121 IU/L   AST 60 (H) 0 - 40 IU/L   ALT 136 (H) 0 - 44 IU/L  Lipid Panel w/o Chol/HDL Ratio   Collection Time: 07/26/23  9:42 AM  Result Value Ref Range   Cholesterol, Total 170 100 - 199 mg/dL   Triglycerides 621 (H) 0 - 149 mg/dL   HDL 48 >30 mg/dL   VLDL Cholesterol Cal 30 5 - 40 mg/dL   LDL Chol Calc (NIH) 92 0 - 99 mg/dL      Assessment & Plan:   Problem List Items Addressed This Visit       Respiratory   Centrilobular emphysema (HCC) - Primary   Ongoing, stable.  Noted on initial lung screening 12/23/22 == educated patient on this finding and nodule, which remains stable on July 2024 recheck. Has Albuterol inhaler to use as needed, discussed with him.  Spirometry next visit and annually.        Relevant Orders   CBC with Differential/Platelet     Endocrine   Hypothyroidism   Chronic, ongoing.  Check thyroid labs annually, done today.  Continue current medication dosage and adjust as needed.      Relevant Medications   levothyroxine (SYNTHROID) 75 MCG tablet   Other Relevant Orders   TSH   T4, free     Other   Cigarette nicotine dependence without complication   Praised for cutting back.  I have recommended complete cessation of tobacco use. I have discussed various options available for  assistance with tobacco cessation including over the counter methods (Nicotine gum, patch and lozenges). We also discussed prescription options (Chantix, Nicotine Inhaler / Nasal Spray). The patient is not interested in pursuing any prescription tobacco cessation options at this time.  Lung screening annually.       Depression, major, single episode, mild (HCC)   Ongoing with substance abuse.  Denies SI/HI.   He is motivated to cut back and work on cessation of alcohol and cocaine use.  Recommend he attend AA meetings to get sponsor for support.  Continue Antabuse.  Is offering benefit to mood and substance abuse.  Seeing therapy as needed.      Hyperlipidemia   Chronic, ongoing with ASCVD 6.4%.  Continue Rosuvastatin as is tolerating and adjust dose as needed.  Lipid panel today.      Relevant Medications   rosuvastatin (CRESTOR) 10 MG tablet   Other Relevant Orders   Comprehensive metabolic panel   Lipid Panel w/o Chol/HDL Ratio   Substance abuse (HCC)   Chronic issue with cocaine and alcohol use. Denies SI/HI. Currently he has not used either since  Years Eve.  Antabuse offering benefit, continue this.  Naltrexone offered no benefit and Gabapentin made depressed.  He is motivated to cut back and continue work on cessation.  Recommend he attend AA meetings to get sponsor for  support.  Antabuse 250 MG daily.      Other Visit Diagnoses       Benign prostatic hyperplasia without lower urinary tract symptoms       PSA on labs today.   Relevant Orders   PSA     Encounter for annual physical exam       Annual physical today, health maintenance reviewed.        LABORATORY TESTING:  Health maintenance labs ordered today as discussed above.   The natural history of prostate cancer and ongoing controversy regarding screening and potential treatment outcomes of prostate cancer has been discussed with the patient. The meaning of a false positive PSA and a false negative PSA has been  discussed. He indicates understanding of the limitations of this screening test and wishes to proceed with screening PSA testing.  IMMUNIZATIONS:   - Tdap: Tetanus vaccination status reviewed: last tetanus booster within 10 years. - Influenza: Up to date - Pneumovax: Up to date - Prevnar: Not applicable - Zostavax vaccine: Up To Date - Covid: refuses  SCREENING: - Colonoscopy: Up to date  Discussed with patient purpose of the colonoscopy is to detect colon cancer at curable precancerous or early stages   - AAA Screening: Not applicable  -Hearing Test: Not applicable  -Spirometry: will obtain next visit   PATIENT COUNSELING:    Sexuality: Discussed sexually transmitted diseases, partner selection, use of condoms, avoidance of unintended pregnancy  and contraceptive alternatives.   Advised to avoid cigarette smoking.  I discussed with the patient that most people either abstain from alcohol or drink within safe limits (<=14/week and <=4 drinks/occasion for males, <=7/weeks and <= 3 drinks/occasion for females) and that the risk for alcohol disorders and other health effects rises proportionally with the number of drinks per week and how often a drinker exceeds daily limits.  Discussed cessation/primary prevention of drug use and availability of treatment for abuse.   Diet: Encouraged to adjust caloric intake to maintain  or achieve ideal body weight, to reduce intake of dietary saturated fat and total fat, to limit sodium intake by avoiding high sodium foods and not adding table salt, and to maintain adequate dietary potassium and calcium preferably from fresh fruits, vegetables, and low-fat dairy products.    Stressed the importance of regular exercise  Injury prevention: Discussed safety belts, safety helmets, smoke detector, smoking near bedding or upholstery.   Dental health: Discussed importance of regular tooth brushing, flossing, and dental visits.   Follow up plan: NEXT  PREVENTATIVE PHYSICAL DUE IN 1 YEAR. Return in about 6 months (around 07/29/2024) for Hypothyroid, HLD, Substance abuse.

## 2024-01-30 NOTE — Assessment & Plan Note (Signed)
Chronic, ongoing.  Check thyroid labs annually, done today.  Continue current medication dosage and adjust as needed.

## 2024-01-31 ENCOUNTER — Encounter: Payer: Self-pay | Admitting: Nurse Practitioner

## 2024-01-31 ENCOUNTER — Encounter: Payer: 59 | Admitting: Nurse Practitioner

## 2024-01-31 LAB — COMPREHENSIVE METABOLIC PANEL
ALT: 43 [IU]/L (ref 0–44)
AST: 27 [IU]/L (ref 0–40)
Albumin: 4.2 g/dL (ref 3.8–4.9)
Alkaline Phosphatase: 92 [IU]/L (ref 44–121)
BUN/Creatinine Ratio: 13 (ref 9–20)
BUN: 12 mg/dL (ref 6–24)
Bilirubin Total: 0.6 mg/dL (ref 0.0–1.2)
CO2: 25 mmol/L (ref 20–29)
Calcium: 9.5 mg/dL (ref 8.7–10.2)
Chloride: 104 mmol/L (ref 96–106)
Creatinine, Ser: 0.92 mg/dL (ref 0.76–1.27)
Globulin, Total: 2.3 g/dL (ref 1.5–4.5)
Glucose: 83 mg/dL (ref 70–99)
Potassium: 4.1 mmol/L (ref 3.5–5.2)
Sodium: 141 mmol/L (ref 134–144)
Total Protein: 6.5 g/dL (ref 6.0–8.5)
eGFR: 98 mL/min/{1.73_m2} (ref >59)

## 2024-01-31 LAB — CBC WITH DIFFERENTIAL/PLATELET
Basophils Absolute: 0.1 10*3/uL (ref 0.0–0.2)
Basos: 1 %
EOS (ABSOLUTE): 0.3 10*3/uL (ref 0.0–0.4)
Eos: 3 %
Hematocrit: 46.8 % (ref 37.5–51.0)
Hemoglobin: 15.6 g/dL (ref 13.0–17.7)
Immature Grans (Abs): 0 10*3/uL (ref 0.0–0.1)
Immature Granulocytes: 0 %
Lymphocytes Absolute: 3.8 10*3/uL — ABNORMAL HIGH (ref 0.7–3.1)
Lymphs: 40 %
MCH: 28.6 pg (ref 26.6–33.0)
MCHC: 33.3 g/dL (ref 31.5–35.7)
MCV: 86 fL (ref 79–97)
Monocytes Absolute: 0.7 10*3/uL (ref 0.1–0.9)
Monocytes: 7 %
Neutrophils Absolute: 4.6 10*3/uL (ref 1.4–7.0)
Neutrophils: 49 %
Platelets: 260 10*3/uL (ref 150–450)
RBC: 5.45 x10E6/uL (ref 4.14–5.80)
RDW: 12.7 % (ref 11.6–15.4)
WBC: 9.5 10*3/uL (ref 3.4–10.8)

## 2024-01-31 LAB — PSA: Prostate Specific Ag, Serum: 1.3 ng/mL (ref 0.0–4.0)

## 2024-01-31 LAB — TSH: TSH: 1.25 u[IU]/mL (ref 0.450–4.500)

## 2024-01-31 LAB — LIPID PANEL W/O CHOL/HDL RATIO
Cholesterol, Total: 153 mg/dL (ref 100–199)
HDL: 33 mg/dL — ABNORMAL LOW (ref >39)
LDL Chol Calc (NIH): 86 mg/dL (ref 0–99)
Triglycerides: 197 mg/dL — ABNORMAL HIGH (ref 0–149)
VLDL Cholesterol Cal: 34 mg/dL (ref 5–40)

## 2024-01-31 LAB — T4, FREE: Free T4: 1.38 ng/dL (ref 0.82–1.77)

## 2024-01-31 NOTE — Progress Notes (Signed)
Contacted via MyChart   Good afternoon Cyruss, your labs have returned and overall remain stable: - Kidney function, creatinine and eGFR, remains normal, as is liver function, AST and ALT.  - CBC shows no anemia or infection. - Lipid panel does show triglycerides a bit above normal, but LDL stable.  Continue Rosuvastatin 10 MG at this time and focus on healthy diet. - Thyroid labs normal, no Levothyroxine changes needed. - Remainder of labs stable.  Any questions? Keep being stellar!!  Thank you for allowing me to participate in your care.  I appreciate you. Kindest regards, Rakeisha Nyce

## 2024-05-22 ENCOUNTER — Other Ambulatory Visit: Payer: Self-pay | Admitting: Acute Care

## 2024-05-22 DIAGNOSIS — Z87891 Personal history of nicotine dependence: Secondary | ICD-10-CM

## 2024-05-22 DIAGNOSIS — Z122 Encounter for screening for malignant neoplasm of respiratory organs: Secondary | ICD-10-CM

## 2024-06-07 ENCOUNTER — Encounter: Payer: Self-pay | Admitting: Nurse Practitioner

## 2024-06-27 ENCOUNTER — Ambulatory Visit
Admission: RE | Admit: 2024-06-27 | Discharge: 2024-06-27 | Disposition: A | Discharge status: Home / Self Care | Source: Ambulatory Visit | Attending: Acute Care | Admitting: Acute Care

## 2024-06-27 DIAGNOSIS — Z87891 Personal history of nicotine dependence: Secondary | ICD-10-CM | POA: Insufficient documentation

## 2024-06-27 DIAGNOSIS — Z122 Encounter for screening for malignant neoplasm of respiratory organs: Secondary | ICD-10-CM | POA: Insufficient documentation

## 2024-07-08 ENCOUNTER — Other Ambulatory Visit: Payer: Self-pay | Admitting: Acute Care

## 2024-07-08 DIAGNOSIS — F17210 Nicotine dependence, cigarettes, uncomplicated: Secondary | ICD-10-CM

## 2024-07-08 DIAGNOSIS — Z122 Encounter for screening for malignant neoplasm of respiratory organs: Secondary | ICD-10-CM

## 2024-07-08 DIAGNOSIS — Z87891 Personal history of nicotine dependence: Secondary | ICD-10-CM

## 2024-07-27 NOTE — Patient Instructions (Addendum)
 1-800-QUIT-NOW [phone])  Be Involved in Caring For Your Health:  Taking Medications When medications are taken as directed, they can greatly improve your health. But if they are not taken as prescribed, they may not work. In some cases, not taking them correctly can be harmful. To help ensure your treatment remains effective and safe, understand your medications and how to take them. Bring your medications to each visit for review by your provider.  Your lab results, notes, and after visit summary will be available on My Chart. We strongly encourage you to use this feature. If lab results are abnormal the clinic will contact you with the appropriate steps. If the clinic does not contact you assume the results are satisfactory. You can always view your results on My Chart. If you have questions regarding your health or results, please contact the clinic during office hours. You can also ask questions on My Chart.  We at [facility] are grateful that you chose us  to provide your care. We strive to provide evidence-based and compassionate care and are always looking for feedback. If you get a survey from the clinic please complete this so we can hear your opinions.  Heart-Healthy Eating Plan Many factors influence your heart health, including eating and exercise habits. Heart health is also called coronary health. Coronary risk increases with abnormal blood fat (lipid) levels. A heart-healthy eating plan includes limiting unhealthy fats, increasing healthy fats, limiting salt (sodium) intake, and making other diet and lifestyle changes. What is my plan? Your health care provider may recommend that: You limit your fat intake to _________% or less of your total calories each day. You limit your saturated fat intake to _________% or less of your total calories each day. You limit the amount of cholesterol in your diet to less than _________ mg per day. You limit the amount of sodium  in your diet to less than _________ mg per day. What are tips for following this plan? Cooking Cook foods using methods other than frying. Baking, boiling, grilling, and broiling are all good options. Other ways to reduce fat include: Removing the skin from poultry. Removing all visible fats from meats. Steaming vegetables in water  or broth. Meal planning  At meals, imagine dividing your plate into fourths: Fill one-half of your plate with vegetables and green salads. Fill one-fourth of your plate with whole grains. Fill one-fourth of your plate with lean protein foods. Eat 2-4 cups of vegetables per day. One cup of vegetables equals 1 cup (91 g) broccoli or cauliflower florets, 2 medium carrots, 1 large bell pepper, 1 large sweet potato, 1 large tomato, 1 medium white potato, 2 cups (150 g) raw leafy greens. Eat 1-2 cups of fruit per day. One cup of fruit equals 1 small apple, 1 large banana, 1 cup (237 g) mixed fruit, 1 large orange,  cup (82 g) dried fruit, 1 cup (240 mL) 100% fruit juice. Eat more foods that contain soluble fiber. Examples include apples, broccoli, carrots, beans, peas, and barley. Aim to get 25-30 g of fiber per day. Increase your consumption of legumes, nuts, and seeds to 4-5 servings per week. One serving of dried beans or legumes equals  cup (90 g) cooked, 1 serving of nuts is  oz (12 almonds, 24 pistachios, or 7 walnut halves), and 1 serving of seeds equals  oz (8 g). Fats Choose healthy fats more often. Choose monounsaturated and polyunsaturated fats, such as olive and canola oils, avocado oil, flaxseeds, walnuts, almonds,  and seeds. Eat more omega-3 fats. Choose salmon, mackerel, sardines, tuna, flaxseed oil, and ground flaxseeds. Aim to eat fish at least 2 times each week. Check food labels carefully to identify foods with trans fats or high amounts of saturated fat. Limit saturated fats. These are found in animal products, such as meats, butter, and cream.  Plant sources of saturated fats include palm oil, palm kernel oil, and coconut oil. Avoid foods with partially hydrogenated oils in them. These contain trans fats. Examples are stick margarine, some tub margarines, cookies, crackers, and other baked goods. Avoid fried foods. General information Eat more home-cooked food and less restaurant, buffet, and fast food. Limit or avoid alcohol. Limit foods that are high in added sugar and simple starches such as foods made using white refined flour (white breads, pastries, sweets). Lose weight if you are overweight. Losing just 5-10% of your body weight can help your overall health and prevent diseases such as diabetes and heart disease. Monitor your sodium intake, especially if you have high blood pressure. Talk with your health care provider about your sodium intake. Try to incorporate more vegetarian meals weekly. What foods should I eat? Fruits All fresh, canned (in natural juice), or frozen fruits. Vegetables Fresh or frozen vegetables (raw, steamed, roasted, or grilled). Green salads. Grains Most grains. Choose whole wheat and whole grains most of the time. Rice and pasta, including brown rice and pastas made with whole wheat. Meats and other proteins Lean, well-trimmed beef, veal, pork, and lamb. Chicken and [place] without skin. All fish and shellfish. Wild duck, rabbit, pheasant, and venison. Egg whites or low-cholesterol egg substitutes. Dried beans, peas, lentils, and tofu. Seeds and most nuts. Dairy Low-fat or nonfat cheeses, including ricotta and mozzarella. Skim or 1% milk (liquid, powdered, or evaporated). Buttermilk made with low-fat milk. Nonfat or low-fat yogurt. Fats and oils Non-hydrogenated (trans-free) margarines. Vegetable oils, including soybean, sesame, sunflower, olive, avocado, peanut, safflower, corn, canola, and cottonseed. Salad dressings or mayonnaise made with a vegetable oil. Beverages Water  (mineral or sparkling).  Coffee and tea. Unsweetened ice tea. Diet beverages. Sweets and desserts Sherbet, gelatin, and fruit ice. Small amounts of dark chocolate. Limit all sweets and desserts. Seasonings and condiments All seasonings and condiments. The items listed above may not be a complete list of foods and beverages you can eat. Contact a dietitian for more options. What foods should I avoid? Fruits Canned fruit in heavy syrup. Fruit in cream or butter sauce. Fried fruit. Limit coconut. Vegetables Vegetables cooked in cheese, cream, or butter sauce. Fried vegetables. Grains Breads made with saturated or trans fats, oils, or whole milk. Croissants. Sweet rolls. Donuts. High-fat crackers, such as cheese crackers and chips. Meats and other proteins Fatty meats, such as hot dogs, ribs, sausage, bacon, rib-eye roast or steak. High-fat deli meats, such as salami and bologna. Caviar. Domestic duck and goose. Organ meats, such as liver. Dairy Cream, sour cream, cream cheese, and creamed cottage cheese. Whole-milk cheeses. Whole or 2% milk (liquid, evaporated, or condensed). Whole buttermilk. Cream sauce or high-fat cheese sauce. Whole-milk yogurt. Fats and oils Meat fat, or shortening. Cocoa butter, hydrogenated oils, palm oil, coconut oil, palm kernel oil. Solid fats and shortenings, including bacon fat, salt pork, lard, and butter. Nondairy cream substitutes. Salad dressings with cheese or sour cream. Beverages Regular sodas and any drinks with added sugar. Sweets and desserts Frosting. Pudding. Cookies. Cakes. Pies. Milk chocolate or white chocolate. Buttered syrups. Full-fat ice cream or ice cream drinks. The items  listed above may not be a complete list of foods and beverages to avoid. Contact a dietitian for more information. Summary Heart-healthy meal planning includes limiting unhealthy fats, increasing healthy fats, limiting salt (sodium) intake and making other diet and lifestyle changes. Lose weight if  you are overweight. Losing just 5-10% of your body weight can help your overall health and prevent diseases such as diabetes and heart disease. Focus on eating a balance of foods, including fruits and vegetables, low-fat or nonfat dairy, lean protein, nuts and legumes, whole grains, and heart-healthy oils and fats. This information is not intended to replace advice given to you by your health care provider. Make sure you discuss any questions you have with your health care provider. Document Revised: 01/10/2022 Document Reviewed: 01/10/2022 Elsevier Patient Education  2024 [employer].

## 2024-07-29 ENCOUNTER — Encounter: Payer: Self-pay | Admitting: Nurse Practitioner

## 2024-07-29 ENCOUNTER — Ambulatory Visit (INDEPENDENT_AMBULATORY_CARE_PROVIDER_SITE_OTHER): Admitting: Nurse Practitioner

## 2024-07-29 VITALS — BP 108/69 | HR 64 | Temp 98.2°F | Ht 65.7 in | Wt 175.6 lb

## 2024-07-29 DIAGNOSIS — J432 Centrilobular emphysema: Secondary | ICD-10-CM

## 2024-07-29 DIAGNOSIS — E782 Mixed hyperlipidemia: Secondary | ICD-10-CM | POA: Diagnosis not present

## 2024-07-29 DIAGNOSIS — F17210 Nicotine dependence, cigarettes, uncomplicated: Secondary | ICD-10-CM

## 2024-07-29 DIAGNOSIS — Z0184 Encounter for antibody response examination: Secondary | ICD-10-CM

## 2024-07-29 DIAGNOSIS — F320 Major depressive disorder, single episode, mild: Secondary | ICD-10-CM

## 2024-07-29 DIAGNOSIS — Z23 Encounter for immunization: Secondary | ICD-10-CM | POA: Diagnosis not present

## 2024-07-29 DIAGNOSIS — E039 Hypothyroidism, unspecified: Secondary | ICD-10-CM

## 2024-07-29 DIAGNOSIS — F1910 Other psychoactive substance abuse, uncomplicated: Secondary | ICD-10-CM | POA: Diagnosis not present

## 2024-07-29 DIAGNOSIS — Z1159 Encounter for screening for other viral diseases: Secondary | ICD-10-CM

## 2024-07-29 DIAGNOSIS — Z8619 Personal history of other infectious and parasitic diseases: Secondary | ICD-10-CM | POA: Insufficient documentation

## 2024-07-29 MED ORDER — DISULFIRAM 250 MG PO TABS: 250.0000 mg | ORAL_TABLET | Freq: Every day | ORAL | 2 refills | Status: AC

## 2024-07-29 NOTE — Assessment & Plan Note (Signed)
 Chronic, ongoing with ASCVD 8.7%.  Continue Rosuvastatin  as is tolerating and adjust dose as needed.  Lipid panel today.

## 2024-07-29 NOTE — Assessment & Plan Note (Signed)
 Chronic, ongoing.  Check thyroid  labs annually, next visit.  Continue current medication dosage and adjust as needed.

## 2024-07-29 NOTE — Assessment & Plan Note (Signed)
 Ongoing, stable.  Noted on initial lung screening 12/23/22 and continues to be noted on screening. Has Albuterol  inhaler to use as needed, discussed with him.  Spirometry next visit and annually.

## 2024-07-29 NOTE — Assessment & Plan Note (Signed)
 Praised for cutting back.  I have recommended complete cessation of tobacco use. I have discussed various options available for assistance with tobacco cessation including over the counter methods (Nicotine gum, patch and lozenges). We also discussed prescription options (Chantix, Nicotine Inhaler / Nasal Spray). The patient is not interested in pursuing any prescription tobacco cessation options at this time.  Lung screening annually.

## 2024-07-29 NOTE — Progress Notes (Signed)
 BP 108/69   Pulse 64   Temp 98.2 F (36.8 C) (Oral)   Ht 5' 5.7 (1.669 m)   Wt 175 lb 9.6 oz (79.7 kg)   SpO2 98%   BMI 28.60 kg/m    Subjective:    Patient ID: Earl Baker, male    DOB: 04/14/1966, 58 y.o.   MRN: [id number]  HPI: Earl Baker is a 58 y.o. male  Chief Complaint  Patient presents with   Hyperlipidemia   Hypothyroidism   HYPERLIPIDEMIA Takes Rosuvastatin . Hyperlipidemia status: good compliance Satisfied with current treatment?  yes Side effects:  no Supplements: none Aspirin:  no The 10-year ASCVD risk score (Arnett DK, et al., 2019) is: 8.7%   Values used to calculate the score:     Age: 79 years     Clincally relevant sex: Male     Is Non-Hispanic African American: No     Diabetic: No     Tobacco smoker: Yes     Systolic Blood Pressure: 108 mmHg     Is BP treated: No     HDL Cholesterol: 33 mg/dL     Total Cholesterol: 153 mg/dL Chest pain:  no Coronary artery disease:  no Family history CAD:  no Family history early CAD:  no   COPD Mild centrilobular emphysema continues to be noted on lung screening, last 06/27/24. Continues to smoke about 1 pack per week, used to be 1 PPD.  Was about 19 when started smoking.  Taking Antabuse  daily for alcohol use reduction, started on 10/09/23.  Continues on this and has not used Cocaine since beginning of the year.  No alcohol use since the beginning of the year.  Does not attend [employer]. COPD status: stable Satisfied with current treatment?: yes Oxygen use: no Dyspnea frequency: no Cough frequency: no Rescue inhaler frequency: does not use one Limitation of activity: no Productive cough: none Last Spirometry: unknown Pneumovax: Up to Date Influenza: Up to Date   HYPOTHYROIDISM Continues on Levothyroxine  75 MCG. Thyroid  control status:stable Satisfied with current treatment? yes Medication side effects: no Medication compliance: good compliance Etiology of hypothyroidism:  unknown Recent dose adjustment:no Fatigue: no Cold intolerance: no Heat intolerance: no Weight gain: no Weight loss: no Constipation: no Diarrhea/loose stools: no Palpitations: no Lower extremity edema: no Anxiety/depressed mood: no     07/29/2024    3:35 PM 01/30/2024    3:08 PM 10/09/2023    4:09 PM 09/06/2023    4:14 PM 07/26/2023    9:05 AM  Depression screen PHQ 2/9  Decreased Interest 1 1 2 1 2   Down, Depressed, Hopeless 1 1 2 2 2   PHQ - 2 Score 2 2 4 3 4   Altered sleeping 1 1 1 1 1   Tired, decreased energy 1 1 3 3 3   Change in appetite 1 1 1 1 2   Feeling bad or failure about yourself  1 1 2 1 1   Trouble concentrating 1 1 1 1  0  Moving slowly or fidgety/restless 0 0 0 0 0  Suicidal thoughts 0 0 0 0 0  PHQ-9 Score 7 7 12 10 11   Difficult doing work/chores Somewhat difficult Somewhat difficult Somewhat difficult Somewhat difficult Somewhat difficult       07/29/2024    3:35 PM 01/30/2024    3:08 PM 10/09/2023    4:10 PM 09/06/2023    4:14 PM  GAD 7 : Generalized Anxiety Score  Nervous, Anxious, on Edge 1 1 0 1  Control/stop worrying  1 1 1 1   Worry too much - different things 1 1 1 1   Trouble relaxing 0 1 0 1  Restless 0 0 0 0  Easily annoyed or irritable 1 1 1 1   Afraid - awful might happen 1 1 1 1   Total GAD 7 Score 5 6 4 6   Anxiety Difficulty Somewhat difficult Somewhat difficult Somewhat difficult Somewhat difficult   Relevant past medical, surgical, family and social history reviewed and updated as indicated. Interim medical history since our last visit reviewed. Allergies and medications reviewed and updated.  Review of Systems  Constitutional:  Negative for activity change, diaphoresis, fatigue and fever.  Respiratory:  Negative for cough, chest tightness, shortness of breath and wheezing.   Cardiovascular:  Negative for chest pain, palpitations and leg swelling.  Gastrointestinal: Negative.   Endocrine: Negative for cold intolerance and heat intolerance.   Neurological: Negative.   Psychiatric/Behavioral: Negative.     Per HPI unless specifically indicated above     Objective:    BP 108/69   Pulse 64   Temp 98.2 F (36.8 C) (Oral)   Ht 5' 5.7 (1.669 m)   Wt 175 lb 9.6 oz (79.7 kg)   SpO2 98%   BMI 28.60 kg/m   Wt Readings from Last 3 Encounters:  07/29/24 175 lb 9.6 oz (79.7 kg)  01/30/24 180 lb 9.6 oz (81.9 kg)  10/09/23 183 lb 12.8 oz (83.4 kg)    Physical Exam Vitals and nursing note reviewed.  Constitutional:      General: He is awake. He is not in acute distress.    Appearance: He is well-developed and well-groomed. He is not ill-appearing or toxic-appearing.  HENT:     Head: Normocephalic.     Right Ear: Hearing and external ear normal.     Left Ear: Hearing and external ear normal.  Eyes:     General: Lids are normal.     Extraocular Movements: Extraocular movements intact.     Conjunctiva/sclera: Conjunctivae normal.  Neck:     Thyroid : No thyromegaly.     Vascular: No carotid bruit.  Cardiovascular:     Rate and Rhythm: Normal rate and regular rhythm.     Heart sounds: Normal heart sounds. No murmur heard.    No gallop.  Pulmonary:     Effort: No accessory muscle usage or respiratory distress.     Breath sounds: Normal breath sounds.  Abdominal:     General: Bowel sounds are normal. There is no distension.     Palpations: Abdomen is soft.     Tenderness: There is no abdominal tenderness.  Musculoskeletal:     Cervical back: Full passive range of motion without pain.     Right lower leg: No edema.     Left lower leg: No edema.  Lymphadenopathy:     Cervical: No cervical adenopathy.  Skin:    General: Skin is warm.     Capillary Refill: Capillary refill takes less than 2 seconds.  Neurological:     Mental Status: He is alert and oriented to person, place, and time.     Deep Tendon Reflexes: Reflexes are normal and symmetric.     Reflex Scores:      Brachioradialis reflexes are 2+ on the right side  and 2+ on the left side.      Patellar reflexes are 2+ on the right side and 2+ on the left side. Psychiatric:        Attention and Perception: Attention  normal.        Mood and Affect: Mood normal.        Speech: Speech normal.        Behavior: Behavior normal. Behavior is cooperative.        Thought Content: Thought content normal.    Results for orders placed or performed in visit on 01/30/24  CBC with Differential/Platelet   Collection Time: 01/30/24  3:26 PM  Result Value Ref Range   WBC 9.5 3.4 - 10.8 x10E3/uL   RBC 5.45 4.14 - 5.80 x10E6/uL   Hemoglobin 15.6 13.0 - 17.7 g/dL   Hematocrit 53.1 62.4 - 51.0 %   MCV 86 79 - 97 fL   MCH 28.6 26.6 - 33.0 pg   MCHC 33.3 31.5 - 35.7 g/dL   RDW 87.2 88.3 - 84.5 %   Platelets 260 150 - 450 x10E3/uL   Neutrophils 49 Not Estab. %   Lymphs 40 Not Estab. %   Monocytes 7 Not Estab. %   Eos 3 Not Estab. %   Basos 1 Not Estab. %   Neutrophils Absolute 4.6 1.4 - 7.0 x10E3/uL   Lymphocytes Absolute 3.8 (H) 0.7 - 3.1 x10E3/uL   Monocytes Absolute 0.7 0.1 - 0.9 x10E3/uL   EOS (ABSOLUTE) 0.3 0.0 - 0.4 x10E3/uL   Basophils Absolute 0.1 0.0 - 0.2 x10E3/uL   Immature Granulocytes 0 Not Estab. %   Immature Grans (Abs) 0.0 0.0 - 0.1 x10E3/uL  Comprehensive metabolic panel   Collection Time: 01/30/24  3:26 PM  Result Value Ref Range   Glucose 83 70 - 99 mg/dL   BUN 12 6 - 24 mg/dL   Creatinine, Ser 9.07 0.76 - 1.27 mg/dL   eGFR 98 >40 fO/fpw/8.26   BUN/Creatinine Ratio 13 9 - 20   Sodium 141 134 - 144 mmol/L   Potassium 4.1 3.5 - 5.2 mmol/L   Chloride 104 96 - 106 mmol/L   CO2 25 20 - 29 mmol/L   Calcium  9.5 8.7 - 10.2 mg/dL   Total Protein 6.5 6.0 - 8.5 g/dL   Albumin 4.2 3.8 - 4.9 g/dL   Globulin, Total 2.3 1.5 - 4.5 g/dL   Bilirubin Total 0.6 0.0 - 1.2 mg/dL   Alkaline Phosphatase 92 44 - 121 IU/L   AST 27 0 - 40 IU/L   ALT 43 0 - 44 IU/L  TSH   Collection Time: 01/30/24  3:26 PM  Result Value Ref Range   TSH 1.250 0.450 -  4.500 uIU/mL  PSA   Collection Time: 01/30/24  3:26 PM  Result Value Ref Range   Prostate Specific Ag, Serum 1.3 0.0 - 4.0 ng/mL  Lipid Panel w/o Chol/HDL Ratio   Collection Time: 01/30/24  3:26 PM  Result Value Ref Range   Cholesterol, Total 153 100 - 199 mg/dL   Triglycerides 802 (H) 0 - 149 mg/dL   HDL 33 (L) >60 mg/dL   VLDL Cholesterol Cal 34 5 - 40 mg/dL   LDL Chol Calc (NIH) 86 0 - 99 mg/dL  T4, free   Collection Time: 01/30/24  3:26 PM  Result Value Ref Range   Free T4 1.38 0.82 - 1.77 ng/dL      Assessment & Plan:   Problem List Items Addressed This Visit       Respiratory   Centrilobular emphysema (HCC) - Primary   Ongoing, stable.  Noted on initial lung screening 12/23/22 and continues to be noted on screening. Has Albuterol  inhaler to use as needed,  discussed with him.  Spirometry next visit and annually.          Digestive   History of hepatitis C   Reports history of 20 years ago and treated with clearance.  He is unsure if he had Hep B vaccines, will check immunity.      Relevant Orders   Hepatitis B surface antibody,quantitative     Endocrine   Hypothyroidism   Chronic, ongoing.  Check thyroid  labs annually, next visit.  Continue current medication dosage and adjust as needed.        Other   Substance abuse (HCC)   Chronic issue with cocaine and alcohol use. Denies SI/HI. Currently he has not used either since [place] Years Eve.  Antabuse  offering benefit, continue this.  Naltrexone  offered no benefit and Gabapentin  made depressed.  He is motivated to cut back and continue work on cessation.  Recommend he attend AA meetings to get sponsor for support.  Antabuse  250 MG daily.      Hyperlipidemia   Chronic, ongoing with ASCVD 8.7%.  Continue Rosuvastatin  as is tolerating and adjust dose as needed.  Lipid panel today.      Relevant Orders   Comprehensive metabolic panel with GFR   Lipid Panel w/o Chol/HDL Ratio   Depression, major, single episode, mild  (HCC)   Ongoing with substance abuse.  Denies SI/HI.   He has been working hard at cessation with no alcohol or cocaine use since beginning of the year.  Recommend he attend AA meetings to get sponsor for support.  Continue Antabuse .  Is offering benefit to mood and substance abuse.  Seeing therapy as needed.      Cigarette nicotine dependence without complication   Praised for cutting back.  I have recommended complete cessation of tobacco use. I have discussed various options available for assistance with tobacco cessation including over the counter methods (Nicotine gum, patch and lozenges). We also discussed prescription options (Chantix, Nicotine Inhaler / Nasal Spray). The patient is not interested in pursuing any prescription tobacco cessation options at this time.  Lung screening annually.       Other Visit Diagnoses       Immunity status testing       Hep B antibody testing to determine need for vaccines.   Relevant Orders   Hepatitis B surface antibody,quantitative     Pneumococcal vaccination given       PCV20 in office today, educated patient on this.   Relevant Orders   Pneumococcal conjugate vaccine 20-valent (Completed)        Follow up plan: Return in about 6 months (around 01/29/2025) for Annual Physical after 01/29/25.

## 2024-07-29 NOTE — Assessment & Plan Note (Signed)
 Ongoing with substance abuse.  Denies SI/HI.   He has been working hard at cessation with no alcohol or cocaine use since beginning of the year.  Recommend he attend AA meetings to get sponsor for support.  Continue Antabuse .  Is offering benefit to mood and substance abuse.  Seeing therapy as needed.

## 2024-07-29 NOTE — Assessment & Plan Note (Signed)
 Reports history of 20 years ago and treated with clearance.  He is unsure if he had Hep B vaccines, will check immunity.

## 2024-07-29 NOTE — Assessment & Plan Note (Signed)
 Chronic issue with cocaine and alcohol use. Denies SI/HI. Currently he has not used either since [place] Years Eve.  Antabuse offering benefit, continue this.  Naltrexone offered no benefit and Gabapentin made depressed.  He is motivated to cut back and continue work on cessation.  Recommend he attend AA meetings to get sponsor for support.  Antabuse 250 MG daily.

## 2024-07-30 ENCOUNTER — Ambulatory Visit: Payer: Self-pay | Admitting: Nurse Practitioner

## 2024-07-30 ENCOUNTER — Ambulatory Visit: Payer: 59 | Admitting: Nurse Practitioner

## 2024-07-30 LAB — COMPREHENSIVE METABOLIC PANEL WITH GFR
ALT: 29 IU/L (ref 0–44)
AST: 24 IU/L (ref 0–40)
Albumin: 4.5 g/dL (ref 3.8–4.9)
Alkaline Phosphatase: 67 IU/L (ref 44–121)
BUN/Creatinine Ratio: 17 (ref 9–20)
BUN: 15 mg/dL (ref 6–24)
Bilirubin Total: 1 mg/dL (ref 0.0–1.2)
CO2: 21 mmol/L (ref 20–29)
Calcium: 9.4 mg/dL (ref 8.7–10.2)
Chloride: 105 mmol/L (ref 96–106)
Creatinine, Ser: 0.89 mg/dL (ref 0.76–1.27)
Globulin, Total: 2.3 g/dL (ref 1.5–4.5)
Glucose: 106 mg/dL — ABNORMAL HIGH (ref 70–99)
Potassium: 4.1 mmol/L (ref 3.5–5.2)
Sodium: 141 mmol/L (ref 134–144)
Total Protein: 6.8 g/dL (ref 6.0–8.5)
eGFR: 101 mL/min/1.73 (ref >59)

## 2024-07-30 LAB — LIPID PANEL W/O CHOL/HDL RATIO
Cholesterol, Total: 164 mg/dL (ref 100–199)
HDL: 39 mg/dL — ABNORMAL LOW (ref >39)
LDL Chol Calc (NIH): 103 mg/dL — ABNORMAL HIGH (ref 0–99)
Triglycerides: 119 mg/dL (ref 0–149)
VLDL Cholesterol Cal: 22 mg/dL (ref 5–40)

## 2024-07-30 LAB — HEPATITIS B SURFACE ANTIBODY, QUANTITATIVE: Hepatitis B Surf Ab Quant: <3.5 m[IU]/mL — ABNORMAL LOW

## 2024-09-04 ENCOUNTER — Ambulatory Visit: Admitting: Nurse Practitioner

## 2024-09-04 ENCOUNTER — Encounter: Payer: Self-pay | Admitting: Family Medicine

## 2024-09-04 ENCOUNTER — Ambulatory Visit: Payer: Self-pay

## 2024-09-04 ENCOUNTER — Ambulatory Visit (INDEPENDENT_AMBULATORY_CARE_PROVIDER_SITE_OTHER): Admitting: Family Medicine

## 2024-09-04 VITALS — BP 122/86 | HR 81 | Temp 98.0°F | Resp 18 | Ht 65.7 in | Wt 183.0 lb

## 2024-09-04 DIAGNOSIS — M7918 Myalgia, other site: Secondary | ICD-10-CM

## 2024-09-04 MED ORDER — CYCLOBENZAPRINE HCL 10 MG PO TABS: 10.0000 mg | ORAL_TABLET | Freq: Three times a day (TID) | ORAL | 0 refills | Status: DC | PRN

## 2024-09-04 MED ORDER — MELOXICAM 15 MG PO TABS: 15.0000 mg | ORAL_TABLET | Freq: Every day | ORAL | 0 refills | Status: AC

## 2024-09-04 MED ORDER — MELOXICAM 15 MG PO TABS: 15.0000 mg | ORAL_TABLET | Freq: Every day | ORAL | 0 refills | Status: DC

## 2024-09-04 MED ORDER — CYCLOBENZAPRINE HCL 10 MG PO TABS: 10.0000 mg | ORAL_TABLET | Freq: Three times a day (TID) | ORAL | 0 refills | Status: AC | PRN

## 2024-09-04 NOTE — Progress Notes (Signed)
 Established Patient Office Visit  Subjective   Patient ID: Earl Baker, male    DOB: [birth year]  Age: 58 y.o. MRN: [id number]  Chief Complaint  Patient presents with   Back Pain    Back Pain  Discussed the use of AI scribe software for clinical note transcription with the patient, who gave verbal consent to proceed.  History of Present Illness   Earl Baker is a 58 year old male with a history of slipped discs who presents with worsening lower back pain.  He has been experiencing significant lower back pain that began worsening two weeks ago. The initial injury occurred two years ago while lifting a dresser, after which he experienced significant back pain. The pain had started to ease five days ago but worsened again two days ago. It is located across the lower back at belt level, without radiation down the legs.  The pain is exacerbated by sitting and standing, and it worsens when leaning forward. He has not tried any treatments such as heat, ice, or ibuprofen for this episode. This is only the second time the pain has been this severe since the initial injury.  He is currently taking levothyroxine  75 micrograms daily, Crestor  10 milligrams daily, and Antabuse . He is doing well with Antabuse  and is using it to help avoid alcohol consumption.  Socially, he is single and has plans to play golf at the beach next week, which is a concern due to his back pain. He worked a twelve-hour night shift last night and is off work [employer] night.       Objective:     BP 122/86 (BP Location: Left Arm, Patient Position: Sitting, Cuff Size: Normal)   Pulse 81   Temp 98 F (36.7 C) (Oral)   Resp 18   Ht 5' 5.7 (1.669 m)   Wt 183 lb (83 kg)   SpO2 94%   BMI 29.81 kg/m    Physical Exam Musculoskeletal:     Comments: Tenderness to palpation paraspinous lumbar muscles.  FROM ext and flex at waist.  Negative supine leg lift bilaterally to 45 degrees           No results found for any visits on 09/04/24.    The 10-year ASCVD risk score (Arnett DK, et al., 2019) is: 10.1%    Assessment & Plan:  Lumbar muscle pain -     Ambulatory referral to Physical Therapy -     Meloxicam ; Take 1 tablet (15 mg total) by mouth daily.  Dispense: 30 tablet; Refill: 0 -     Cyclobenzaprine  HCl; Take 1 tablet (10 mg total) by mouth 3 (three) times daily as needed for muscle spasms.  Dispense: 30 tablet; Refill: 0    Assessment and Plan    Low back pain due to muscle strain Acute exacerbation of low back pain, likely due to muscle strain. Pain is localized to the lower back, across both sides at belt level, without radiation to the legs. Pain worsens with sitting and standing, and improves with lying down.. Symptoms exacerbated by recent physical activities and poor sleeping posture. Muscle strain is the more likely cause of the patient's back pain. He is concerned about upcoming golf trip and seeks relief. - Prescribe meloxicam  once daily for pain management. Advise against concurrent use of ibuprofen or other NSAIDs. - Prescribe cyclobenzaprine  (Flexeril ) as a muscle relaxant. - Initiate urgent referral to physical therapy for evaluation and management. Emphasize the importance of exercises  to prevent future episodes. - Educate on proper techniques for getting out of bed to minimize back strain. - Advise to monitor phone for contact from physical therapy to schedule an appointment.  Alcohol use disorder, in remission with disulfiram  therapy Alcohol use disorder in remission, maintained with disulfiram  (Antabuse ) therapy. He reports adherence to medication and abstinence from alcohol. - Continue disulfiram  therapy as prescribed. Reinforce the importance of adherence to prevent alcohol consumption.       Return if symptoms worsen or fail to improve.    Jenia Klepper K Kino Dunsworth, MD

## 2024-09-04 NOTE — Telephone Encounter (Signed)
 FYI Only or Action Required?: FYI only for provider.  Patient was last seen in primary care on 07/29/2024 by Valerio Melanie DASEN, NP.  Called Nurse Triage reporting Appointment.  Symptoms began n/a.  Interventions attempted: Other: n/a.  Symptoms are: n/a.  Triage Disposition: Information or Advice Only Call  Patient/caregiver understands and will follow disposition?:   Copied from CRM [id number]. Topic: Clinical - Red Word Triage >> Sep 04, 2024 12:00 PM Fonda T wrote: [facility] that prompted transfer to Nurse Triage: Patient calling states he is having back pain.  Patient had a scheduled appointment today with provider, appointment was cancelled as provider is not in office.  Patient reports needs to be seen as soon as possible for back pain. Reason for Disposition  [1] Follow-up call to recent contact AND [2] information only call, no triage required  Answer Assessment - Initial Assessment Questions 1. REASON FOR CALL: What is the main reason for your call? or How can I best help you?     Received call that his appointment with pcp today for low back pain need to be rescheduled due to provider out of office. Rescheduled today at 130pm, alternate regional office. Patient aware of location.  Protocols used: Information Only Call - No Triage-A-AH

## 2024-09-04 NOTE — Telephone Encounter (Signed)
 Ok for E2C2 to review.  Left message for patient to inform his appointment for today has been reschedule. Please assist with rescheduling when he returns call.

## 2024-09-06 NOTE — Therapy (Signed)
 OUTPATIENT PHYSICAL THERAPY THORACOLUMBAR EVALUATION   Patient Name: Earl Baker MRN: [id number] DOB:20-Dec-1965, 58 y.o., male Today's Date: 09/14/2024  END OF SESSION:  PT End of Session - 09/14/24 2011     Visit Number 1    Number of Visits 9    Date for Recertification  10/08/24    Authorization Type eval: 09/10/24    PT Start Time 1315    PT Stop Time 1400    PT Time Calculation (min) 45 min    Activity Tolerance Patient tolerated treatment well    Behavior During Therapy [facility] for tasks assessed/performed         Past Medical History:  Diagnosis Date   Alcoholism /alcohol abuse 09/22/2015   Arthritis    Emphysema of lung (HCC)    Hep C w/o coma, chronic (HCC)    Treated.  Pre 2010.   Hyperlipidemia    Hypothyroidism    Low back pain    Olecranon bursitis 07/24/2019   Sleep apnea    Substance abuse [facility]    Past Surgical History:  Procedure Laterality Date   APPENDECTOMY     COLONOSCOPY WITH PROPOFOL  N/A 01/03/2019   Procedure: COLONOSCOPY WITH PROPOFOL ;  Surgeon: Jinny Carmine, MD;  Location: [facility];  Service: Endoscopy;  Laterality: N/A;   POLYPECTOMY  01/03/2019   Procedure: POLYPECTOMY;  Surgeon: Jinny Carmine, MD;  Location: [facility];  Service: Endoscopy;;   Patient Active Problem List   Diagnosis Date Noted   History of hepatitis C 07/29/2024   Depression, major, single episode, mild 07/26/2023   Lung nodule 12/26/2022   Centrilobular emphysema (HCC) 12/26/2022   Chronic bilateral low back pain without sciatica 11/30/2022   Cigarette nicotine dependence without complication 10/10/2022   Hyperlipidemia    Rectal polyp    Substance abuse (HCC) 09/22/2015   Hypothyroidism    PCP: Valerio Melanie DASEN, NP  REFERRING PROVIDER: Ziglar, Susan K, MD  REFERRING DIAG: M79.18 (ICD-10-CM) - Lumbar muscle pain  RATIONALE FOR EVALUATION AND TREATMENT: Rehabilitation  THERAPY DIAG: Other low back pain  ONSET DATE: 08/21/24  (approximate)  FOLLOW-UP APPT SCHEDULED WITH REFERRING PROVIDER: Yes    SUBJECTIVE:                                                                                                                                                                                         SUBJECTIVE STATEMENT:  Low back pain   PERTINENT HISTORY:  Pt has been experiencing significant lower back pain that began worsening three weeks ago. No significant trauma reported at onset. The pain is located across the lower back at belt level,  without radiation down the legs. He saw Dr. Ziglar who diagnosed him with a likely muscle strain and prescribed meloxicam  and cyclobenzaprine . He has used them both prn without significant improvement. The initial injury occurred two years ago while lifting a dresser, after which he experienced significant back pain. He has had ongoing chronic back pain since that time with intermittent flares which he attributes to a slipped disc.  With respect to his most recent flare he initially experienced some improvement however symptoms worsened again. He also has a history of chronic L knee pain which is slightly worse today. He leaves tomorrow for a 4 day [employer] trip with his friends. They will be playing 4, 18 hole rounds of golf. He has played one round of golf since the acute exacerbation began without worsening of his pain. Pt is a left handed golfer. Per medical record he is currently taking levothyroxine  75 micrograms daily, Crestor  10 milligrams daily, and Antabuse .   PAIN:    Pain Intensity: Present: 2/10, Best: 1-2/10, Worst: 7/10 Pain location: Bilateral low back;  Pain Quality: sharp Radiating: No  Numbness/Tingling: No Focal Weakness: No Aggravating factors: movement, bending, laying on the side;  Relieving factors: stretching, no improvement with medications, no improvement with ice/heat, better with supine 24-hour pain behavior: activity dependent;  How long can you  sit: How long can you stand: History of prior back injury, pain, surgery, or therapy: Yes Dominant hand: left Imaging: Yes  Red flags: Positive: skin CA, Negative for bowel/bladder changes, saddle paresthesia,  h/o spinal tumors, h/o compression fx, h/o abdominal aneurysm, abdominal pain, chills/fever, night sweats, nausea, vomiting, unrelenting pain  EXAM: LUMBAR SPINE - COMPLETE 4+ VIEW   COMPARISON:  04/11/2017.   FINDINGS: Slight retrolisthesis of L5 on S1 where there is posterior disc space narrowing, endplate degenerative changes and facet hypertrophy. Alignment is otherwise anatomic. Vertebral body heights are maintained. Prominent anterior marginal osteophytosis at L3-4 and L4-5. L4-5 facet hypertrophy. No definite pars defects.   IMPRESSION: 1. No acute findings. 2. Slight retrolisthesis of L5 on S1 with secondary degenerative disc disease and facet hypertrophy. 3. Prominent anterior marginal osteophytosis at L3-4 and L4-5. 4. L4-5 facet hypertrophy.  PRECAUTIONS: None  WEIGHT BEARING RESTRICTIONS: No  FALLS: Has patient fallen in last 6 months? No  Living Environment No issues navigating home environment  Prior level of function: Independent  Occupational demands: walking, bending, not much lifting  Hobbies: golf, fish, hunt  Patient Goals: Decrease pain and go on his golf trip;   OBJECTIVE:  Patient Surveys  Deferred  Cognition Patient is oriented to person, place, and time.  Recent memory is intact.  Remote memory is intact.  Attention span and concentration are intact.  Expressive speech is intact.  Patient's fund of knowledge is within normal limits for educational level.    Gross Musculoskeletal Assessment Tremor: None Bulk: Normal Tone: Normal No visible step-off along spinal column, no signs of scoliosis  GAIT: Antalgic with decreased trunk rotation  Posture: Lumbar lordosis: Decreased resting lordosis Iliac crest height: Equal  bilaterally Lumbar lateral shift: Negative  AROM AROM (Normal range in degrees) AROM   Lumbar   Flexion (65) Severely limited and painful  Extension (30) Moderately limited and painful  Right lateral flexion (25) Moderately limited and painful  Left lateral flexion (25) Moderately limited and painful  Right rotation (30) Moderately limited and painful  Left rotation (30) Moderately limited and painful      Hip Right Left  Flexion (  125) WNL WNL  Extension (15)    Abduction (40)    Adduction     Internal Rotation (45)    External Rotation (45)        Knee    Flexion (135) WNL WNL  Extension (0)    (* = pain; Blank rows = not tested)  LE MMT: MMT (out of 5) Right  Left   Hip flexion 5 5  Hip extension 4* 4*  Hip abduction    Hip adduction    Hip internal rotation 5 5  Hip external rotation 5 5  Knee flexion 5 5  Knee extension 5 5  Ankle dorsiflexion 5 5  (* = pain; Blank rows = not tested)  Sensation Deferred  Reflexes Deferred  Muscle Length Hamstrings: R: Negative L: Negative Ely (quadriceps): R: Negative L: Negative Thomas (hip flexors): R: Not examined L: Not examined Ober: R: Not examined L: Not examined  Palpation Location Right Left         Lumbar paraspinals 1 1  Quadratus Lumborum 1 1  Gluteus Maximus 0 0  Gluteus Medius 0 0  Deep hip external rotators    PSIS    Fortin's Area (SIJ) 0 0  Greater Trochanter 0 0  (Blank rows = not tested) Graded on 0-4 scale (0 = no pain, 1 = pain, 2 = pain with wincing/grimacing/flinching, 3 = pain with withdrawal, 4 = unwilling to allow palpation)  Passive Accessory  Motion Pt reports reproduction of back pain with CPA L4-L5 and UPA bilaterally L4-L5. Generally, hypomobile throughout  Special Tests Lumbar Radiculopathy and Discogenic: Centralization and Peripheralization (SN 92, -LR 0.12): Not examined Slump (SN 83, -LR 0.32): R: Negative L: Negative SLR (SN 92, -LR 0.29): R: Negative L:   Negative Crossed SLR (SP 90): R: Negative L: Negative  Facet Joint: Extension-Rotation (SN 100, -LR 0.0): R: Positive L: Positive  Lumbar Foraminal Stenosis: Lumbar quadrant (SN 70): R: Positive L: Positive  Hip: FABER (SN 81): R: Negative L: Negative FADIR (SN 94): R: Negative L: Negative Hip scour (SN 50): R: Negative L: Negative  SIJ:  Thigh Thrust (SN 88, -LR 0.18) : R: Not examined L: Not examined  Piriformis Syndrome: FAIR Test (SN 88, SP 83): R: Not examined L: Not examined  Functional Tasks Golf Swing: Limited rotation during swing, pt reporting no pain but appears to be limiting swing and guarding his back  Beighton scale Deferred   TODAY'S TREATMENT:  Ther-ex  Education provided regarding examination findings, prognosis, and plan of care; HEP furnished and pt educated about how to perform exercises;   PATIENT EDUCATION:  Education details: Plan of care and HEP Person educated: Patient Education method: Programmer, multimedia, Facilities manager, Verbal cues, and Handouts Education comprehension: verbalized understanding   HOME EXERCISE PROGRAM:  Access Code: [email or web address] URL: [email or web address] Date: 09/14/2024 Prepared by: Selinda Eck  Exercises - Supine Single Knee to Chest Stretch  - 2-3 x daily - 7 x weekly - 3 reps - 30-45s hold - Hooklying Lumbar Rotation  - 2-3 x daily - 7 x weekly - 60s hold - Cat Cow  - 2-3 x daily - 7 x weekly - 3 reps - 30-45s hold - Child's Pose Stretch  - 2-3 x daily - 7 x weekly - 3 reps - 30-45s hold   ASSESSMENT:  CLINICAL IMPRESSION: Patient is a 58 y.o. male who was seen today for physical therapy evaluation and treatment for acute on chronic low back pain. Back pain appears to be  related to likely muscle spasm as well as lower lumbar facet mediated pain. His lumbar active and passive ROM are both very limited and painful today. No signs of nerve root compression clinically with exam. He is able to demonstrate a golf  swing during evaluation however it is very guarded with limited trunk rotation. Therapist advised that he can attempt to play golf on his trip but not to push through or ignore pain. He was also instructed to take his medications as prescribed by his physician to decrease acute inflammation however he is concerned that the meloxicam  might interact with alcohol. Therapist advised pt that he would have to discuss this with his MD. Pt asks about chiropractic care however he is not a good candidate for spinal manipulation due to retrolisthesis on imaging and active guarding. He would benefit from ongoing PT for pain control as well as motor retraining and strengthening however he does not appear interested at this time in following up for additional therapy.   OBJECTIVE IMPAIRMENTS: Abnormal gait, decreased activity tolerance, decreased ROM, postural dysfunction, and pain.   ACTIVITY LIMITATIONS: bending, sitting, standing, and sleeping  PARTICIPATION LIMITATIONS: cleaning, community activity, and golf  PERSONAL FACTORS: Age, Behavior pattern, Time since onset of injury/illness/exacerbation, and 1-2 comorbidities: substance abuse, sleep apnea are also affecting patient's functional outcome.   REHAB POTENTIAL: Good  CLINICAL DECISION MAKING: Evolving/moderate complexity  EVALUATION COMPLEXITY: Moderate   GOALS: Goals reviewed with patient? No  SHORT TERM GOALS: Target date: 09/24/2024  Pt will be independent with HEP in order to improve strength and decrease back pain to improve pain-free function at home and work. Baseline:  Goal status: INITIAL   LONG TERM GOALS: Target date: 10/08/2024  1.  Pt will decrease worst back pain by at least 2 points on the NPRS in order to demonstrate clinically significant reduction in back pain. Baseline: worst: 7/10 Goal status: INITIAL  2.  Pt will decrease mODI score by at least 13 points in order demonstrate clinically significant reduction in back  pain/disability.       Baseline: To be completed Goal status: INITIAL  PLAN: PT FREQUENCY: 1-2x/week  PT DURATION: 4 weeks  PLANNED INTERVENTIONS: Therapeutic exercises, Therapeutic activity, Neuromuscular re-education, Balance training, Gait training, Patient/Family education, Self Care, Joint mobilization, Joint manipulation, Vestibular training, Canalith repositioning, Orthotic/Fit training, DME instructions, Dry Needling, Electrical stimulation, Spinal manipulation, Spinal mobilization, Cryotherapy, Moist heat, Taping, Traction, Ultrasound, Ionotophoresis 4mg /ml Dexamethasone, Manual therapy, and Re-evaluation.  PLAN FOR NEXT SESSION: progress modalities/manual for pain modulation, progressive strengthening   Selinda BIRCH Mayerly Kaman PT, DPT, GCS  Valeska Haislip, PT 09/14/2024, 8:12 PM

## 2024-09-10 ENCOUNTER — Ambulatory Visit: Attending: Family Medicine

## 2024-09-10 DIAGNOSIS — M5459 Other low back pain: Secondary | ICD-10-CM | POA: Diagnosis present

## 2024-09-10 DIAGNOSIS — M7918 Myalgia, other site: Secondary | ICD-10-CM | POA: Insufficient documentation

## 2024-09-23 ENCOUNTER — Encounter

## 2024-09-25 ENCOUNTER — Encounter

## 2024-10-03 ENCOUNTER — Encounter

## 2024-10-07 ENCOUNTER — Encounter

## 2024-10-09 ENCOUNTER — Encounter

## 2024-10-14 ENCOUNTER — Encounter

## 2024-10-16 ENCOUNTER — Encounter

## 2025-01-31 ENCOUNTER — Encounter: Admitting: Nurse Practitioner
